# Patient Record
Sex: Female | Born: 1970
Health system: Southern US, Community
[De-identification: ages and names within clinical notes are randomized; demographics above are authoritative.]

## PROBLEM LIST (undated history)

## (undated) DIAGNOSIS — D219 Benign neoplasm of connective and other soft tissue, unspecified: Secondary | ICD-10-CM

## (undated) DIAGNOSIS — J45909 Unspecified asthma, uncomplicated: Secondary | ICD-10-CM

## (undated) DIAGNOSIS — D649 Anemia, unspecified: Secondary | ICD-10-CM

## (undated) HISTORY — DX: Anemia, unspecified: D64.9

## (undated) HISTORY — PX: UTERINE FIBROID SURGERY: SHX826

## (undated) HISTORY — DX: Benign neoplasm of connective and other soft tissue, unspecified: D21.9

---

## 2015-02-26 ENCOUNTER — Emergency Department (HOSPITAL_COMMUNITY)
Admission: EM | Admit: 2015-02-26 | Discharge: 2015-02-26 | Disposition: A | Discharge status: Home / Self Care | Payer: Federal, State, Local not specified - PPO | Source: Home / Self Care | Attending: Emergency Medicine | Admitting: Emergency Medicine

## 2015-02-26 ENCOUNTER — Encounter (HOSPITAL_COMMUNITY): Payer: Self-pay

## 2015-02-26 DIAGNOSIS — L259 Unspecified contact dermatitis, unspecified cause: Secondary | ICD-10-CM

## 2015-02-26 HISTORY — DX: Unspecified asthma, uncomplicated: J45.909

## 2015-02-26 MED ORDER — METHYLPREDNISOLONE ACETATE 80 MG/ML IJ SUSP
INTRAMUSCULAR | Status: AC
  Filled 2015-02-26: qty 1

## 2015-02-26 MED ORDER — METHYLPREDNISOLONE ACETATE 80 MG/ML IJ SUSP: 80.0000 mg | Freq: Once | INTRAMUSCULAR | Status: DC

## 2015-02-26 NOTE — Discharge Instructions (Signed)
Contact Dermatitis Contact dermatitis is a rash that happens when something touches the skin. You touched something that irritates your skin, or you have allergies to something you touched. HOME CARE   Avoid the thing that caused your rash.  Keep your rash away from hot water, soap, sunlight, chemicals, and other things that might bother it.  Do not scratch your rash.  You can take cool baths to help stop itching.  Only take medicine as told by your doctor.  Keep all doctor visits as told. GET HELP RIGHT AWAY IF:   Your rash is not better after 3 days.  Your rash gets worse.  Your rash is puffy (swollen), tender, red, sore, or warm.  You have problems with your medicine. MAKE SURE YOU:   Understand these instructions.  Will watch your condition.  Will get help right away if you are not doing well or get worse. Document Released: 08/31/2009 Document Revised: 01/26/2012 Document Reviewed: 04/08/2011 [facility] Patient Information 2015 [place], [place]. This information is not intended to replace advice given to you by your health care provider. Make sure you discuss any questions you have with your health care provider.    Probably a local reaction or form of eczema. No urgent concerns. The injection will take a few days to fully kick in. Hang in there! :). If this continues would recommend a Dermatologist evaluation. Laurel Dimmer is really good! Nice to meet you.

## 2015-02-26 NOTE — ED Notes (Signed)
C/o facial rash since Thursday. Minimal relief w benadryl . C/o face feels "tingling and tight". NAD

## 2015-02-26 NOTE — ED Provider Notes (Signed)
CSN: [id number]     Arrival date & time 02/26/15  1916 History   First MD Initiated Contact with Patient 02/26/15 2008     Chief Complaint  Patient presents with  . Rash   (Consider location/radiation/quality/duration/timing/severity/associated sxs/prior Treatment) HPI Comments: Patient presents with a facial rash since Thursday requesting a quick fix. It is pruritic. She carries a family history of eczema as well. She did change detergent and perhaps this has irritated her. She has tried otc cortisone cream and her daughters rx cream without great relief.   Patient is a 44 y.o. female presenting with rash. The history is provided by the patient.  Rash   Past Medical History  Diagnosis Date  . Asthma    Past Surgical History  Procedure Laterality Date  . Uterine fibroid surgery     History reviewed. No pertinent family history. History  Substance Use Topics  . Smoking status: Never Smoker   . Smokeless tobacco: Not on file  . Alcohol Use: No   OB History    No data available     Review of Systems  Skin: Positive for rash.  All other systems reviewed and are negative.   Allergies  Penicillins  Home Medications   Prior to Admission medications   Not on File   BP 140/82 mmHg  Pulse 87  Temp(Src) 98.7 F (37.1 C) (Oral)  Resp 12  SpO2 100% Physical Exam  Constitutional: She is oriented to person, place, and time. She appears well-developed and well-nourished.  Pulmonary/Chest: Effort normal.  Neurological: She is alert and oriented to person, place, and time.  Skin: Skin is warm and dry.  Macular papular rash to forehead, nose, cheeks and upper neck. No vessicles  Psychiatric: Her behavior is normal.  Nursing note and vitals reviewed.   ED Course  Procedures (including critical care time) Labs Review Labs Reviewed - No data to display  Imaging Review No results found.   MDM   1. Contact dermatitis and eczema    Requested "shot". Ok to treat with  Depomedrol IM 80mg  given. May take a few days to start improving. No emergent needs. Recommend f/u with Dermatology if persists.     Bjorn Pippin, PA-C 02/26/15 2126

## 2016-09-03 DIAGNOSIS — Z30013 Encounter for initial prescription of injectable contraceptive: Secondary | ICD-10-CM | POA: Diagnosis not present

## 2016-09-03 DIAGNOSIS — Z01419 Encounter for gynecological examination (general) (routine) without abnormal findings: Secondary | ICD-10-CM | POA: Diagnosis not present

## 2017-01-01 ENCOUNTER — Emergency Department (HOSPITAL_COMMUNITY): Payer: Federal, State, Local not specified - PPO

## 2017-01-01 ENCOUNTER — Emergency Department (HOSPITAL_COMMUNITY)
Admission: EM | Admit: 2017-01-01 | Discharge: 2017-01-01 | Disposition: A | Discharge status: Home / Self Care | Payer: Federal, State, Local not specified - PPO | Attending: Emergency Medicine | Admitting: Emergency Medicine

## 2017-01-01 ENCOUNTER — Encounter (HOSPITAL_COMMUNITY): Payer: Self-pay

## 2017-01-01 DIAGNOSIS — M545 Low back pain: Secondary | ICD-10-CM | POA: Insufficient documentation

## 2017-01-01 DIAGNOSIS — D649 Anemia, unspecified: Secondary | ICD-10-CM | POA: Diagnosis not present

## 2017-01-01 DIAGNOSIS — J45909 Unspecified asthma, uncomplicated: Secondary | ICD-10-CM | POA: Diagnosis not present

## 2017-01-01 DIAGNOSIS — M25551 Pain in right hip: Secondary | ICD-10-CM

## 2017-01-01 DIAGNOSIS — M5441 Lumbago with sciatica, right side: Secondary | ICD-10-CM | POA: Diagnosis not present

## 2017-01-01 LAB — I-STAT CHEM 8, ED
BUN: 19 mg/dL (ref 6–20)
CALCIUM ION: 1.17 mmol/L (ref 1.15–1.40)
CHLORIDE: 104 mmol/L (ref 101–111)
Creatinine, Ser: 0.7 mg/dL (ref 0.44–1.00)
GLUCOSE: 85 mg/dL (ref 65–99)
HCT: 32 % — ABNORMAL LOW (ref 36.0–46.0)
HEMOGLOBIN: 10.9 g/dL — AB (ref 12.0–15.0)
POTASSIUM: 4.1 mmol/L (ref 3.5–5.1)
SODIUM: 140 mmol/L (ref 135–145)
TCO2: 26 mmol/L (ref 0–100)

## 2017-01-01 LAB — URINALYSIS, ROUTINE W REFLEX MICROSCOPIC
Bilirubin Urine: NEGATIVE
Glucose, UA: NEGATIVE mg/dL
HGB URINE DIPSTICK: NEGATIVE
Ketones, ur: NEGATIVE mg/dL
Leukocytes, UA: NEGATIVE
NITRITE: NEGATIVE
PROTEIN: NEGATIVE mg/dL
SPECIFIC GRAVITY, URINE: 1.011 (ref 1.005–1.030)
pH: 7 (ref 5.0–8.0)

## 2017-01-01 LAB — POC URINE PREG, ED: Preg Test, Ur: NEGATIVE

## 2017-01-01 MED ORDER — KETOROLAC TROMETHAMINE 30 MG/ML IJ SOLN
30.0000 mg | Freq: Once | INTRAMUSCULAR | Status: AC
  Administered 2017-01-01: 30 mg via INTRAMUSCULAR
  Filled 2017-01-01: qty 1

## 2017-01-01 MED ORDER — PREDNISONE 10 MG (21) PO TBPK
10.0000 mg | ORAL_TABLET | Freq: Every day | ORAL | 0 refills | Status: DC
Start: 2017-01-01 — End: 2017-06-03

## 2017-01-01 MED ORDER — CYCLOBENZAPRINE HCL 10 MG PO TABS
10.0000 mg | ORAL_TABLET | Freq: Three times a day (TID) | ORAL | 0 refills | Status: DC | PRN
Start: 2017-01-01 — End: 2017-06-03

## 2017-01-01 NOTE — Discharge Instructions (Signed)
Your x-rays were normal. This is likely sciatic pain in her lower back. Take the steroids as prescribed. May use Tylenol for pain. Need to follow up with her primary care doctor to have her hemoglobin rechecked. If you develop any dizziness, shortness of breath, blood in her stool, lightheadedness please return to the ED. If you develop any loss of bowel or bladder, or unable to urinate, numbness in her groin return to the ED. Follow-up with orthopedist if your back pain continues.

## 2017-01-01 NOTE — ED Provider Notes (Signed)
[facility] DEPT Provider Note   CSN: [id number] Arrival date & time: 01/01/17  0907     History   Chief Complaint Chief Complaint  Patient presents with  . Back Pain    HPI Tracy Santos is a 46 y.o. female.  46 yo African-American female with past medical history significant for asthma presents to the ED today with complaint of low back pain and right hip pain times "a couple of months". Patient states that her pain started in her lower back and has since radiated to her right hip. She thought that it was a mattress that she was sleeping on and they recently changed her mattress. She denies any injury. Patient states that when she lies down and turn side to side the pain is worse. Nothing makes the pain better. She has been trying Aleve and Motrin daily for pain with little relief. Patient describes the pain as aching/sharp. Patient denies any fever, chills, headache, vision changes, bowel or bladder incontinence, urinary retention, saddle paresthesias, lower extremity paresthesias, history of IV drug use, history of cancer, lower extremity weakness, nausea, emesis, urinary symptoms including urgency, frequency, hematuria, dysuria, vaginal discharge/bleeding, change in bowel habits. Patient has been ambulatory with normal gait since the onset of her pain.      Past Medical History:  Diagnosis Date  . Asthma     There are no active problems to display for this patient.   Past Surgical History:  Procedure Laterality Date  . UTERINE FIBROID SURGERY      OB History    No data available       Home Medications    Prior to Admission medications   Not on File    Family History No family history on file.  Social History Social History  Substance Use Topics  . Smoking status: Never Smoker  . Smokeless tobacco: Never Used  . Alcohol use No     Allergies   Penicillins   Review of Systems Review of Systems  Constitutional: Negative for chills and  fever.  Gastrointestinal: Negative for diarrhea, nausea and vomiting.  Genitourinary: Negative for dysuria, flank pain, frequency and urgency.  Musculoskeletal: Positive for arthralgias, back pain and myalgias. Negative for gait problem, neck pain and neck stiffness.  Skin: Negative.   Neurological: Negative for dizziness, syncope, weakness, light-headedness, numbness and headaches.  All other systems reviewed and are negative.    Physical Exam Updated Vital Signs BP 139/98   Pulse 71   Temp 98.1 F (36.7 C) (Oral)   Resp 16   Ht 5\' 8"  (1.727 m)   Wt 117 kg   LMP 12/18/2016 (Within Days)   SpO2 100%   BMI 39.23 kg/m   Physical Exam  Constitutional: She is oriented to person, place, and time. She appears well-developed and well-nourished. No distress.  She is nontoxic-appearing and sitting in the bed comfortably in no acute distress.  HENT:  Head: Normocephalic and atraumatic.  Eyes: Conjunctivae and EOM are normal. Pupils are equal, round, and reactive to light. Right eye exhibits no discharge. Left eye exhibits no discharge. No scleral icterus.  Neck: Normal range of motion. Neck supple.  Pulmonary/Chest: No respiratory distress.  Abdominal:  No CVA tenderness.  Musculoskeletal: Normal range of motion.  Patient with midline L-spine tenderness. She does have tenderness to palpation of the paraspinal lumbar muscles. Note T-spine midline tenderness. No deformities or step-offs noted. Pain radiates to the right hip with palpation. Positive straight leg raise test on the right that  reproduces the pain but no paresthesias. No erythema, ecchymosis, edema noted. Strength is 5 out of 5 in lower extremity. Sensation intact sharp/dull. Cap refill normal. DP pulses are 2+ bilaterally. Patient is able to mainly with normal gait.  Neurological: She is alert and oriented to person, place, and time.  The patient is alert, attentive, and oriented x 3. Speech is clear. Cranial nerve II-VII  grossly intact. Negative pronator drift. Sensation intact. Strength 5/5 in all extremities. Reflexes 2+ and symmetric at biceps, triceps, knees, and ankles. Rapid alternating movement and fine finger movements intact. Romberg is absent. Posture and gait normal.   Skin: Skin is warm and dry. Capillary refill takes less than 2 seconds. No pallor.  Nursing note and vitals reviewed.    ED Treatments / Results  Labs (all labs ordered are listed, but only abnormal results are displayed) Labs Reviewed  URINALYSIS, ROUTINE W REFLEX MICROSCOPIC - Abnormal; Notable for the following:       Result Value   Color, Urine STRAW (*)    All other components within normal limits  I-STAT CHEM 8, ED - Abnormal; Notable for the following:    Hemoglobin 10.9 (*)    HCT 32.0 (*)    All other components within normal limits  POC URINE PREG, ED    EKG  EKG Interpretation None       Radiology Dg Lumbar Spine Complete  Result Date: 01/01/2017 CLINICAL DATA:  Low back pain for about 6 months, right hip pain 2 months EXAM: LUMBAR SPINE - COMPLETE 4+ VIEW COMPARISON:  None. FINDINGS: Five views of the lumbar spine submitted. No acute fracture or subluxation. Alignment, disc spaces and vertebral body heights are preserved. IMPRESSION: Negative. Electronically Signed   By: Lahoma Crocker M.D.   On: 01/01/2017 11:30   Dg Hip Unilat W Or Wo Pelvis 2-3 Views Right  Result Date: 01/01/2017 CLINICAL DATA:  Back pain, right hip pain for about 2 months EXAM: DG HIP (WITH OR WITHOUT PELVIS) 2-3V RIGHT COMPARISON:  None. FINDINGS: Three views of the right hip submitted. No acute fracture or subluxation. Minimal superior acetabular spurring. Minimal spurring or tendinous calcifications adjacent to greater femoral trochanter. Bilateral hip joints are symmetrical in appearance. IMPRESSION: No acute fracture or subluxation. Bilateral hip joints are symmetrical in appearance. Minimal spurring or tendinous calcifications adjacent  to right greater femoral trochanter. Electronically Signed   By: Lahoma Crocker M.D.   On: 01/01/2017 11:32    Procedures Procedures (including critical care time)  Medications Ordered in ED Medications  ketorolac (TORADOL) 30 MG/ML injection 30 mg (not administered)     Initial Impression / Assessment and Plan / ED Course  I have reviewed the triage vital signs and the nursing notes.  Pertinent labs & imaging results that were available during my care of the patient were reviewed by me and considered in my medical decision making (see chart for details).     Patient presents to the ED with complaints of low back pain and right hip pain. Symptoms have been ongoing for 3 months. Patient does have midline L-spine tenderness. Positive straight leg raise test on the right. We will obtain lumbar and right hip imaging. We will check urine for signs of infection. Will check I stat chem 8 for kidney function in order to give Toradol. Symptoms likely consistent with sciatic pain. It imaging is normal and UA without signs of infection will treat with steroids and Toradol. No neurological deficits and normal neuro exam.  Patient can walk but states is painful.  No loss of bowel or bladder control.  No concern for cauda equina.  No fever, night sweats, weight loss, h/o cancer, IVDU.  Imaging shows no acute abnormalities. Patient is ambulatory with normal gait. Her creatinine was normal and we'll give Toradol. Patient noted to have a hemoglobin of 10.9. Patient states that she has been told the past that she has anemia. Unaware of her baseline hemoglobin. She denies any melena or hematochezia. She denies any lightheadedness or dizziness or shortness of breath. She is not symptomatic. Encouraged to follow up PCP to have her hemoglobin rechecked. Given her strict return precautions she develops any blood in her stool or worse symptoms with the anemia. Urine shows no signs of infection. Will treat with Toradol and  steroids and follow-up with orthopedist for symptoms persist. Patient given lower back stretching instructions. RICE protocol and pain medicine indicated and discussed with patient. Pt is hemodynamically stable, in NAD, & able to ambulate in the ED. Pain has been managed & has no complaints prior to dc. Pt is comfortable with above plan and is stable for discharge at this time. All questions were answered prior to disposition. Strict return precautions for f/u to the ED were discussed.    Final Clinical Impressions(s) / ED Diagnoses   Final diagnoses:  Acute right-sided low back pain with right-sided sciatica  Right hip pain  Anemia, unspecified type    New Prescriptions New Prescriptions   PREDNISONE (STERAPRED UNI-PAK 21 TAB) 10 MG (21) TBPK TABLET    Take 1 tablet (10 mg total) by mouth daily. Take 6 tabs by mouth daily  for 2 days, then 5 tabs for 2 days, then 4 tabs for 2 days, then 3 tabs for 2 days, 2 tabs for 2 days, then 1 tab by mouth daily for 2 days     Doristine Devoid, PA-C 01/01/17 [place], MD 01/01/17 [phone]

## 2017-01-01 NOTE — ED Triage Notes (Signed)
Pt presents for evaluation of lower back and R hip pain x "couple months." Pt denies injury. Reports has been taking aleve and motrin daily for pain. States pain improves slightly with medication but is much worse when laying in bed. Describes as aching/sharp. Pt denies neurological changes or incontinence.

## 2017-01-01 NOTE — ED Notes (Signed)
Ambulated pt to restroom to collect urine sample, no issues. Pt back on monitor.

## 2017-01-01 NOTE — ED Notes (Signed)
Pt A&Ox4, ambulatory at d/c with steady gait, NAD and verbalized understanding of d/c instructions, prescriptions and follow up care.

## 2017-06-03 ENCOUNTER — Encounter: Payer: Self-pay | Admitting: Obstetrics and Gynecology

## 2017-06-03 ENCOUNTER — Ambulatory Visit (INDEPENDENT_AMBULATORY_CARE_PROVIDER_SITE_OTHER): Payer: Federal, State, Local not specified - PPO | Admitting: Obstetrics and Gynecology

## 2017-06-03 VITALS — BP 130/90 | HR 88 | Resp 16 | Ht 67.25 in | Wt 265.0 lb

## 2017-06-03 DIAGNOSIS — N852 Hypertrophy of uterus: Secondary | ICD-10-CM | POA: Diagnosis not present

## 2017-06-03 DIAGNOSIS — Z3009 Encounter for other general counseling and advice on contraception: Secondary | ICD-10-CM | POA: Diagnosis not present

## 2017-06-03 DIAGNOSIS — R030 Elevated blood-pressure reading, without diagnosis of hypertension: Secondary | ICD-10-CM | POA: Diagnosis not present

## 2017-06-03 DIAGNOSIS — N920 Excessive and frequent menstruation with regular cycle: Secondary | ICD-10-CM

## 2017-06-03 DIAGNOSIS — Z86018 Personal history of other benign neoplasm: Secondary | ICD-10-CM | POA: Diagnosis not present

## 2017-06-03 LAB — POCT URINE PREGNANCY: PREG TEST UR: NEGATIVE

## 2017-06-03 NOTE — Progress Notes (Signed)
46 y.o. [id number] MarriedAfrican AmericanF here c/o irregular menstrual cycles.   She got one dose of Depo-provera in October of 2018. Cycles started getting irregular in November. She went on it to make her cycles go away. Also for contraception.  Since November cycles have been coming about every 3 weeks, bleeding for 8-10 days (3 x since she had the depo-provea), up from 4 days. Cycles were normal flow up until the last few months, changing a super tampon in 2-3 hours. She can now saturate a super tampon in 2 hours.  No constipation, dry skin, hair loss or fatigue, no weight changes.  Sexually active, no pain.  She was just using W/D for contraception prior to and since the depo-provea.  She tried to get pregnant a few years ago and it didn't happen.  Period Duration (Days): cycles coming twice a month Period Pattern: (!) Irregular Menstrual Flow: Heavy Menstrual Control: Tampon, Maxi pad Menstrual Control Change Freq (Hours): changes tampon and pad every 2 hours  Dysmenorrhea: None  Patient's last menstrual period was 05/16/2017.          Sexually active: Yes.    The current method of family planning is none.    Exercising: No.  The patient does not participate in regular exercise at present. Smoker:  no  Health Maintenance: Pap:  08/2016 WNL per patient (at health clinic) History of abnormal Pap:  22 years ago MMG:  2013 WNL per patient Colonoscopy: Never  BMD:   Never TDaP:  2017  Gardasil: N/A   reports that she has never smoked. She has never used smokeless tobacco. She reports that she does not drink alcohol or use drugs.kids are 43 (daughter) and 51 (son). She moved here 2 years ago from [place]. She is a Development worker, community carrier.   Past Medical History:  Diagnosis Date  . Asthma   . Fibroid     Past Surgical History:  Procedure Laterality Date  . UTERINE FIBROID SURGERY    She had a laparotomy and myomectomy in 2006.  She had a hysteroscopically removed in 2009 or 2010.   No  current outpatient prescriptions on file.   No current facility-administered medications for this visit.     No family history on file.  Review of Systems  Constitutional: Negative.   HENT: Negative.   Eyes: Negative.   Respiratory: Negative.   Cardiovascular: Negative.   Gastrointestinal: Negative.   Endocrine: Negative.   Genitourinary: Positive for menstrual problem.       Irregular menstrual bleeding   Musculoskeletal: Negative.   Skin: Negative.   Allergic/Immunologic: Negative.   Neurological: Negative.   Psychiatric/Behavioral: Negative.     Exam:   BP 130/90 (BP Location: Left Arm, Patient Position: Sitting, Cuff Size: Large)   Pulse 88   Resp 16   Ht 5' 7.25" (1.708 m)   Wt 265 lb (120.2 kg)   LMP 05/16/2017   BMI 41.20 kg/m   Weight change: @WEIGHTCHANGE @ Height:   Height: 5' 7.25" (170.8 cm)  Ht Readings from Last 3 Encounters:  06/03/17 5' 7.25" (1.708 m)  01/01/17 5\' 8"  (1.727 m)    General appearance: alert, cooperative and appears stated age Head: Normocephalic, without obvious abnormality, atraumatic Neck: no adenopathy, supple, symmetrical, trachea midline and thyroid normal to inspection and palpation Lungs: clear to auscultation bilaterally Cardiovascular: regular rate and rhythm Abdomen: soft, non-tender; bowel sounds normal; no masses,  no organomegaly Extremities: extremities normal, atraumatic, no cyanosis or edema Skin: Skin color, texture, turgor  normal. No rashes or lesions Lymph nodes: Cervical, supraclavicular, and axillary nodes normal. No abnormal inguinal nodes palpated Neurologic: Grossly normal   Pelvic: External genitalia:  no lesions              Urethra:  normal appearing urethra with no masses, tenderness or lesions              Bartholins and Skenes: normal                 Vagina: normal appearing vagina with normal color and discharge, no lesions              Cervix: no lesions               Bimanual Exam:  Uterus:   anteverted, irregularly sized, suspect fibroid on the right, not tender, approximately 10 week sized              Adnexa: no mass, fullness, tenderness               Rectovaginal: Confirms               Anus:  normal sphincter tone, no lesions  Chaperone was present for exam.  A:  Menorrhagia, 3 episodes of bleeding for more than 7 days since she got a shot of depo-provera last fall. She does report heavy cycles  H/O fibroid uterus, enlarged on exam today  Contraception  Elevated BP, she states it goes up and down  P:   CBC, Ferritin, TSH  Return for an ultrasound, possible sonohysterogram  Depending on results, possible mirena IUD candidate, probably not a great candidate for OCP's given BP  Will recheck her BP at her f/u visit

## 2017-06-04 ENCOUNTER — Telehealth: Payer: Self-pay | Admitting: Obstetrics and Gynecology

## 2017-06-04 LAB — CBC
Hematocrit: 29.6 % — ABNORMAL LOW (ref 34.0–46.6)
Hemoglobin: 9 g/dL — ABNORMAL LOW (ref 11.1–15.9)
MCH: 24 pg — ABNORMAL LOW (ref 26.6–33.0)
MCHC: 30.4 g/dL — ABNORMAL LOW (ref 31.5–35.7)
MCV: 79 fL (ref 79–97)
Platelets: 274 10*3/uL (ref 150–379)
RBC: 3.75 x10E6/uL — ABNORMAL LOW (ref 3.77–5.28)
RDW: 15.5 % — ABNORMAL HIGH (ref 12.3–15.4)
WBC: 3.4 10*3/uL (ref 3.4–10.8)

## 2017-06-04 LAB — TSH: TSH: 1.87 u[IU]/mL (ref 0.450–4.500)

## 2017-06-04 LAB — FERRITIN: Ferritin: 6 ng/mL — ABNORMAL LOW (ref 15–150)

## 2017-06-04 NOTE — Telephone Encounter (Signed)
Patients spouse, Lanah Steines,  returned call. Mr Chisom is listed on the Designated Party Release, sign by patient on 06/03/17. Spouse states he and the patient are in the car, the patient is driving and unavailable to talk. Appointment was scheduled through conversation with spouse, on 06/16/17 with Dr Talbert Nan. Spouse will have the patient to call back to our office when she is not driving, in order to convey benefits and appointment instructions.   Routing to dr Talbert Nan

## 2017-06-04 NOTE — Telephone Encounter (Signed)
Call placed to patient to review benefits for a recommended sonohysterogram. Left voicemail requesting a return call.

## 2017-06-05 ENCOUNTER — Telehealth: Payer: Self-pay

## 2017-06-05 NOTE — Telephone Encounter (Addendum)
Spoke with patient. Results given. Patient verbalizes understanding. Patient is scheduled for PUS on 06/16/2017 at 3 pm.

## 2017-06-05 NOTE — Telephone Encounter (Signed)
-----   Message from Salvadore Dom, MD sent at 06/05/2017  8:06 AM EDT ----- Regarding: FW: Please let the patient know that she is anemic with low iron stores. An ultrasound was ordered, please make sure it is scheduled. Have her start taking ferrex 150 mg BID for her anemia. ----- Message ----- From: Nicholes Rough, CMA Sent: 06/03/2017  10:32 AM To: Salvadore Dom, MD

## 2017-06-08 ENCOUNTER — Encounter: Payer: Self-pay | Admitting: Obstetrics and Gynecology

## 2017-06-16 ENCOUNTER — Ambulatory Visit (INDEPENDENT_AMBULATORY_CARE_PROVIDER_SITE_OTHER): Payer: Federal, State, Local not specified - PPO

## 2017-06-16 ENCOUNTER — Other Ambulatory Visit: Payer: Self-pay | Admitting: Obstetrics and Gynecology

## 2017-06-16 ENCOUNTER — Ambulatory Visit (INDEPENDENT_AMBULATORY_CARE_PROVIDER_SITE_OTHER): Payer: Federal, State, Local not specified - PPO | Admitting: Obstetrics and Gynecology

## 2017-06-16 ENCOUNTER — Encounter: Payer: Self-pay | Admitting: Obstetrics and Gynecology

## 2017-06-16 VITALS — BP 142/80 | HR 88 | Resp 16 | Wt 272.0 lb

## 2017-06-16 DIAGNOSIS — Z86018 Personal history of other benign neoplasm: Secondary | ICD-10-CM

## 2017-06-16 DIAGNOSIS — D500 Iron deficiency anemia secondary to blood loss (chronic): Secondary | ICD-10-CM

## 2017-06-16 DIAGNOSIS — D259 Leiomyoma of uterus, unspecified: Secondary | ICD-10-CM

## 2017-06-16 DIAGNOSIS — R030 Elevated blood-pressure reading, without diagnosis of hypertension: Secondary | ICD-10-CM | POA: Diagnosis not present

## 2017-06-16 DIAGNOSIS — N920 Excessive and frequent menstruation with regular cycle: Secondary | ICD-10-CM | POA: Diagnosis not present

## 2017-06-16 DIAGNOSIS — N852 Hypertrophy of uterus: Secondary | ICD-10-CM | POA: Diagnosis not present

## 2017-06-16 MED ORDER — NORETHINDRONE 0.35 MG PO TABS: 1.0000 | ORAL_TABLET | Freq: Every day | ORAL | 0 refills | Status: DC

## 2017-06-16 NOTE — Progress Notes (Signed)
GYNECOLOGY  VISIT   HPI: 46 y.o.   Married  [place] American  female   [phone] with Patient's last menstrual period was 06/13/2017.   here for f/u of menorrhagia, fibroid uterus, and anemia.  No dysmenorrhea, no dyspareunia. Using w/d for contraception (h/o secondary infertility). Recent normal TSH. Hgb of 9 gm/dl, ferritin of 6.   GYNECOLOGIC HISTORY: Patient's last menstrual period was 06/13/2017. Contraception:none Menopausal hormone therapy: none         OB History    Gravida Para Term Preterm AB Living   3 2 2   1 2    SAB TAB Ectopic Multiple Live Births           2         There are no active problems to display for this patient.   Past Medical History:  Diagnosis Date  . Asthma   . Fibroid     Past Surgical History:  Procedure Laterality Date  . UTERINE FIBROID SURGERY      No current outpatient prescriptions on file.   No current facility-administered medications for this visit.      ALLERGIES: Patient has no active allergies.  No family history on file.  Social History   Social History  . Marital status: Married    Spouse name: N/A  . Number of children: N/A  . Years of education: N/A   Occupational History  . Not on file.   Social History Main Topics  . Smoking status: Never Smoker  . Smokeless tobacco: Never Used  . Alcohol use No  . Drug use: No  . Sexual activity: Yes    Partners: Male    Birth control/ protection: None   Other Topics Concern  . Not on file   Social History Narrative  . No narrative on file    Review of Systems  Constitutional: Negative.   HENT: Negative.   Eyes: Negative.   Respiratory: Negative.   Cardiovascular: Negative.   Gastrointestinal: Negative.   Genitourinary: Negative.   Musculoskeletal: Negative.   Skin: Negative.   Neurological: Negative.   Endo/Heme/Allergies: Negative.   Psychiatric/Behavioral: Negative.     PHYSICAL EXAMINATION:    BP (!) 142/80 (BP Location: Right Arm, Patient  Position: Sitting, Cuff Size: Normal)   Pulse 88   Resp 16   Wt 272 lb (123.4 kg)   LMP 06/13/2017   BMI 42.29 kg/m     General appearance: alert, cooperative and appears stated age  Pelvic: External genitalia:  no lesions              Urethra:  normal appearing urethra with no masses, tenderness or lesions              Bartholins and Skenes: normal                 Vagina: normal appearing vagina with normal color and discharge, no lesions              Cervix:no lesions Chaperone was present for exam.  Reviewed ultrasound images with the patient and her husband  ASSESSMENT Menorrhagia Fibroid uterus, one intracavitary, appears to be >= 50% in her cavity. Over 1 cm of myometrium beyond the fibroid. Discussed the options of attempted hysteroscopic resection, then possible mirena IUD, use of micronor. Discussed the option of TLH/BS Anemia    PLAN Start micronor today Will set her up for a hysteroscopic myomectomy    An After Visit Summary was printed and given to the  patient.  25 minutes face to face time of which over 50% was spent in counseling.   CC: Lamont Snowball, RN

## 2017-06-25 ENCOUNTER — Telehealth: Payer: Self-pay | Admitting: Obstetrics and Gynecology

## 2017-06-25 NOTE — Telephone Encounter (Signed)
Call placed to patient to review benefits for a recommended surgical procedure. Left voicemail message requesting a return call.    cc: Tracy Santos  cc: Tracy Santos

## 2017-06-25 NOTE — Telephone Encounter (Signed)
Patient has returned call. Reviewed benefit for recommended surgical procedure. Patient understood information presented. Patient request to speak with a nurse regarding additional questions with the procedure and what she can expect with recovery. Advised patient I will forward to our Nurse Supervisor, to return call and address additional questions.  Routing to Lamont Snowball, RN

## 2017-06-26 NOTE — Telephone Encounter (Signed)
Call to patient. Had questions regarding recovery and time off from work. 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 Discussed procedure day plus 2 post op days ( one additional form the normal) due to job requirements. Potential date options discussed. Patient will check on these and call back to schedule.  Routing to provider for final review. Patient agreeable to disposition. Will close encounter.

## 2017-07-27 ENCOUNTER — Telehealth: Payer: Self-pay | Admitting: *Deleted

## 2017-07-27 NOTE — Telephone Encounter (Signed)
Call to patient to check on plans for surgery. Per ROI can leave message on voice mail. Phone number confirmed on call lo. Left message to call back regarding surgery date plans.

## 2017-08-19 NOTE — Telephone Encounter (Signed)
Follow-up call to patient. Per ROI, cal leave message on cell voice mail. Left message calling for update on plans for surgery. Dates for end of year will fill quickly and need to know her plans for proceeding. Left message to call back with update.

## 2017-09-25 NOTE — Telephone Encounter (Signed)
Call to patient to follow-up on plan for surgery scheduling. Unable to leave message. No answer and no voice mail. Voice mail repeats " I am not able to take your call but stay on the line and your call will be answered shortly." No answer and 1 min 30 sec.

## 2017-09-25 NOTE — Telephone Encounter (Signed)
No response from patient after multiple attempts over several months.  Please advise if any additional attempts are needed.  Routing to Dr Talbert Nan for review.

## 2017-09-28 NOTE — Telephone Encounter (Signed)
The patient will call back if she wants to proceed. Will close the encounter.

## 2017-11-19 ENCOUNTER — Encounter: Payer: Self-pay | Admitting: Obstetrics and Gynecology

## 2019-01-31 ENCOUNTER — Other Ambulatory Visit: Payer: Federal, State, Local not specified - PPO

## 2019-02-01 ENCOUNTER — Other Ambulatory Visit: Payer: Federal, State, Local not specified - PPO

## 2019-03-08 ENCOUNTER — Encounter: Payer: Self-pay | Admitting: Internal Medicine

## 2019-03-08 ENCOUNTER — Other Ambulatory Visit: Payer: Self-pay | Admitting: Internal Medicine

## 2019-03-08 ENCOUNTER — Ambulatory Visit (INDEPENDENT_AMBULATORY_CARE_PROVIDER_SITE_OTHER): Payer: Federal, State, Local not specified - PPO | Admitting: Internal Medicine

## 2019-03-08 ENCOUNTER — Other Ambulatory Visit: Payer: Self-pay

## 2019-03-08 VITALS — BP 130/80 | HR 97 | Temp 98.7°F | Ht 67.5 in | Wt 266.2 lb

## 2019-03-08 DIAGNOSIS — E559 Vitamin D deficiency, unspecified: Secondary | ICD-10-CM | POA: Insufficient documentation

## 2019-03-08 DIAGNOSIS — R42 Dizziness and giddiness: Secondary | ICD-10-CM

## 2019-03-08 DIAGNOSIS — Z0000 Encounter for general adult medical examination without abnormal findings: Secondary | ICD-10-CM | POA: Diagnosis not present

## 2019-03-08 DIAGNOSIS — N926 Irregular menstruation, unspecified: Secondary | ICD-10-CM

## 2019-03-08 DIAGNOSIS — D509 Iron deficiency anemia, unspecified: Secondary | ICD-10-CM | POA: Insufficient documentation

## 2019-03-08 LAB — LIPID PANEL
Cholesterol: 203 mg/dL — ABNORMAL HIGH (ref 0–200)
HDL: 75.7 mg/dL (ref >39.00)
LDL Cholesterol: 117 mg/dL — ABNORMAL HIGH (ref 0–99)
NonHDL: 127.21
Total CHOL/HDL Ratio: 3
Triglycerides: 53 mg/dL (ref 0.0–149.0)
VLDL: 10.6 mg/dL (ref 0.0–40.0)

## 2019-03-08 LAB — COMPREHENSIVE METABOLIC PANEL
ALT: 15 U/L (ref 0–35)
AST: 13 U/L (ref 0–37)
Albumin: 4 g/dL (ref 3.5–5.2)
Alkaline Phosphatase: 111 U/L (ref 39–117)
BUN: 11 mg/dL (ref 6–23)
CO2: 26 mEq/L (ref 19–32)
Calcium: 9.3 mg/dL (ref 8.4–10.5)
Chloride: 103 mEq/L (ref 96–112)
Creatinine, Ser: 0.77 mg/dL (ref 0.40–1.20)
GFR: 96.48 mL/min (ref >60.00)
Glucose, Bld: 86 mg/dL (ref 70–99)
Potassium: 4.2 mEq/L (ref 3.5–5.1)
Sodium: 138 mEq/L (ref 135–145)
Total Bilirubin: 0.3 mg/dL (ref 0.2–1.2)
Total Protein: 6.9 g/dL (ref 6.0–8.3)

## 2019-03-08 LAB — POCT URINE PREGNANCY: Preg Test, Ur: NEGATIVE

## 2019-03-08 LAB — CBC WITH DIFFERENTIAL/PLATELET
Basophils Absolute: 0 10*3/uL (ref 0.0–0.1)
Basophils Relative: 0.7 % (ref 0.0–3.0)
Eosinophils Absolute: 0 10*3/uL (ref 0.0–0.7)
Eosinophils Relative: 0.6 % (ref 0.0–5.0)
HCT: 31.2 % — ABNORMAL LOW (ref 36.0–46.0)
Hemoglobin: 10.1 g/dL — ABNORMAL LOW (ref 12.0–15.0)
Lymphocytes Relative: 36.1 % (ref 12.0–46.0)
Lymphs Abs: 1.4 10*3/uL (ref 0.7–4.0)
MCHC: 32.3 g/dL (ref 30.0–36.0)
MCV: 75.5 fl — ABNORMAL LOW (ref 78.0–100.0)
Monocytes Absolute: 0.3 10*3/uL (ref 0.1–1.0)
Monocytes Relative: 7.1 % (ref 3.0–12.0)
Neutro Abs: 2.2 10*3/uL (ref 1.4–7.7)
Neutrophils Relative %: 55.5 % (ref 43.0–77.0)
Platelets: 303 10*3/uL (ref 150.0–400.0)
RBC: 4.13 Mil/uL (ref 3.87–5.11)
RDW: 16.7 % — ABNORMAL HIGH (ref 11.5–15.5)
WBC: 3.9 10*3/uL — ABNORMAL LOW (ref 4.0–10.5)

## 2019-03-08 LAB — VITAMIN D 25 HYDROXY (VIT D DEFICIENCY, FRACTURES): VITD: 13.75 ng/mL — ABNORMAL LOW (ref 30.00–100.00)

## 2019-03-08 LAB — VITAMIN B12: Vitamin B-12: 281 pg/mL (ref 211–911)

## 2019-03-08 LAB — TSH: TSH: 3.14 u[IU]/mL (ref 0.35–4.50)

## 2019-03-08 MED ORDER — MECLIZINE HCL 25 MG PO TABS: 25.0000 mg | ORAL_TABLET | Freq: Three times a day (TID) | ORAL | 0 refills | Status: DC | PRN

## 2019-03-08 MED ORDER — VITAMIN D (ERGOCALCIFEROL) 1.25 MG (50000 UNIT) PO CAPS: 50000.0000 [IU] | ORAL_CAPSULE | ORAL | 0 refills | Status: DC

## 2019-03-08 NOTE — Addendum Note (Signed)
Addended by: Elmer Picker on: 03/08/2019 03:59 PM   Modules accepted: Orders

## 2019-03-08 NOTE — Progress Notes (Signed)
New Patient Office Visit     CC/Reason for Visit: Establish care, discuss dizziness and menstrual irregularities.  HPI: Tracy Santos is a 48 y.o. female who is coming in today for the above mentioned reasons. She is due for an annual physical. She starting taking a MVI last week. She took 3 tabs at once to give it a "jump-start". She has had 2 episodes of dizziness since then, the second one was severe. This occurred yesterday. She was sitting at home, suddenly had sensation of room spinning around her that lasted for a few minutes. No N/V. No palpitations, SOB, she did not feel like she was going to pass out. She has not had a recent URI. Does not have decreased hearing. Never had ear problems that she is aware of.  She just completed her period 2 weeks ago. She has started bleeding again. She is not sure what age her mother started menopause. She is sexually active with her husband.  Past Medical/Surgical History: Past Medical History:  Diagnosis Date  . Asthma   . Fibroid     Past Surgical History:  Procedure Laterality Date  . UTERINE FIBROID SURGERY      Social History:  reports that she has never smoked. She has never used smokeless tobacco. She reports that she does not drink alcohol or use drugs.  Allergies: No Active Allergies  Family History:  No h/o heart disease, stroke, cancer that she is aware of.  Current Outpatient Medications:  .  acetaminophen (TYLENOL) 325 MG tablet, Take 650 mg by mouth every 6 (six) hours as needed., Disp: , Rfl:  .  Multiple Vitamin (MULTIVITAMIN WITH MINERALS) TABS tablet, Take 1 tablet by mouth daily., Disp: , Rfl:  .  meclizine (MEDI-MECLIZINE) 25 MG tablet, Take 1 tablet (25 mg total) by mouth 3 (three) times daily as needed for dizziness., Disp: 30 tablet, Rfl: 0  Review of Systems:  Constitutional: Denies fever, chills, diaphoresis, appetite change and fatigue.  HEENT: Denies photophobia, eye pain, redness, hearing  loss, ear pain, congestion, sore throat, rhinorrhea, sneezing, mouth sores, trouble swallowing, neck pain, neck stiffness and tinnitus.   Respiratory: Denies SOB, DOE, cough, chest tightness,  and wheezing.   Cardiovascular: Denies chest pain, palpitations and leg swelling.  Gastrointestinal: Denies nausea, vomiting, abdominal pain, diarrhea, constipation, blood in stool and abdominal distention.  Genitourinary: Denies dysuria, urgency, frequency, hematuria, flank pain and difficulty urinating.  Endocrine: Denies: hot or cold intolerance, sweats, changes in hair or nails, polyuria, polydipsia. Musculoskeletal: Denies myalgias, back pain, joint swelling, arthralgias and gait problem.  Skin: Denies pallor, rash and wound.  Neurological: Denies  seizures, syncope, weakness, light-headedness, numbness and headaches.  Hematological: Denies adenopathy. Easy bruising, personal or family bleeding history  Psychiatric/Behavioral: Denies suicidal ideation, mood changes, confusion, nervousness, sleep disturbance and agitation    Physical Exam: Vitals:   03/08/19 1137  BP: 130/80  Pulse: 97  Temp: 98.7 F (37.1 C)  TempSrc: Oral  SpO2: 99%  Weight: 266 lb 3.2 oz (120.7 kg)  Height: 5' 7.5" (1.715 m)    Body mass index is 41.08 kg/m.   Constitutional: NAD, calm, comfortable Eyes: PERRL, lids and conjunctivae normal, wears corrective lenses. ENMT: Mucous membranes are moist. Posterior pharynx clear of any exudate or lesions. Normal dentition.  Neck: normal, supple, no masses, no thyromegaly Respiratory: clear to auscultation bilaterally, no wheezing, no crackles. Normal respiratory effort. No accessory muscle use.  Cardiovascular: Regular rate and rhythm, no murmurs / rubs /  gallops. No extremity edema. 2+ pedal pulses. No carotid bruits.  Musculoskeletal: no clubbing / cyanosis. No joint deformity upper and lower extremities. Good ROM, no contractures. Normal muscle tone.  Psychiatric:  Normal judgment and insight. Alert and oriented x 3. Normal mood.    Impression and Plan:  Dizziness  -I have not been able to elicit dizziness or nystagmus today with the Dixie-Hallpike maneuver; doubt BPPV. -No recent URI to make me suspicious for acute labyrinthitis. -No focal neurologic deficits to make me concerned for a posterior circulation CVA. -Not orthostatic. -Regular heart rhythm (do not think EKG is necessary today). -Will give meclizine to use PRN. -She will notify us if she has recurrent symptoms.  Encounter for preventive health examination  -She will get her CPE labs today (her request since she is fasting ) and will return over the summer for her CPE.  Menstrual abnormality -Check POC pregnancy. -If negative, may be perimenopausal. -Consider GYN referral. -Will check TSH.     Patient Instructions  -Nice seeing you today and I hope you feel better soon!  -Lab work today. Will call you when results are available.  -Take meclizine as needed for dizziness.  -Schedule follow up over the summer for your annual physical.     Lelon Frohlich, MD [facility]

## 2019-03-08 NOTE — Addendum Note (Signed)
Addended by: Elmer Picker on: 03/08/2019 03:57 PM   Modules accepted: Orders

## 2019-03-08 NOTE — Patient Instructions (Signed)
-  Nice seeing you today and I hope you feel better soon!  -Lab work today. Will call you when results are available.  -Take meclizine as needed for dizziness.  -Schedule follow up over the summer for your annual physical.

## 2019-03-15 ENCOUNTER — Encounter: Payer: Self-pay | Admitting: Internal Medicine

## 2019-03-15 ENCOUNTER — Telehealth: Payer: Self-pay | Admitting: Internal Medicine

## 2019-03-15 ENCOUNTER — Other Ambulatory Visit (INDEPENDENT_AMBULATORY_CARE_PROVIDER_SITE_OTHER): Payer: Federal, State, Local not specified - PPO

## 2019-03-15 ENCOUNTER — Other Ambulatory Visit: Payer: Self-pay

## 2019-03-15 DIAGNOSIS — R42 Dizziness and giddiness: Secondary | ICD-10-CM | POA: Diagnosis not present

## 2019-03-15 NOTE — Telephone Encounter (Signed)
Patient wants to know what the labs were for today that she had done.  She is requesting a call back. [phone]

## 2019-03-16 ENCOUNTER — Encounter: Payer: Self-pay | Admitting: Internal Medicine

## 2019-03-16 ENCOUNTER — Other Ambulatory Visit: Payer: Self-pay | Admitting: Internal Medicine

## 2019-03-16 DIAGNOSIS — D508 Other iron deficiency anemias: Secondary | ICD-10-CM

## 2019-03-16 LAB — ANEMIA PANEL
Ferritin: 7 ng/mL — ABNORMAL LOW (ref 15–150)
Folate, Hemolysate: 313 ng/mL
Folate, RBC: 1061 ng/mL (ref >498)
Hematocrit: 29.5 % — ABNORMAL LOW (ref 34.0–46.6)
Iron Saturation: 5 % — CL (ref 15–55)
Iron: 19 ug/dL — ABNORMAL LOW (ref 27–159)
Retic Ct Pct: 1.3 % (ref 0.6–2.6)
Total Iron Binding Capacity: 416 ug/dL (ref 250–450)
UIBC: 397 ug/dL (ref 131–425)
Vitamin B-12: 394 pg/mL (ref 232–1245)

## 2019-03-16 MED ORDER — FERROUS SULFATE 325 (65 FE) MG PO TABS: 325.0000 mg | ORAL_TABLET | Freq: Three times a day (TID) | ORAL | 3 refills | Status: DC

## 2019-03-16 NOTE — Telephone Encounter (Signed)
See lab result note 03/16/2019.

## 2019-03-26 ENCOUNTER — Other Ambulatory Visit: Payer: Self-pay | Admitting: Internal Medicine

## 2019-03-26 DIAGNOSIS — E559 Vitamin D deficiency, unspecified: Secondary | ICD-10-CM

## 2019-05-31 ENCOUNTER — Ambulatory Visit: Payer: Federal, State, Local not specified - PPO | Admitting: Internal Medicine

## 2019-12-07 ENCOUNTER — Telehealth: Payer: Self-pay | Admitting: *Deleted

## 2019-12-07 ENCOUNTER — Other Ambulatory Visit: Payer: Self-pay

## 2019-12-07 NOTE — Telephone Encounter (Signed)
Returned call to patient, she stated that she was trying to see if she could come in earlier than 11:30, offered the 7:30, patient declined, stated that she would just keep her original appointment. Patient pre-screened for appointment.  Copied from [place] [id number]. Topic: General - Other >> Dec 07, 2019  9:03 AM Keene Breath wrote: Reason for CRM: Patient is returning a screening call.  Tried the office 2x, no answer.  CB# [phone]

## 2019-12-08 ENCOUNTER — Encounter: Payer: Self-pay | Admitting: Internal Medicine

## 2019-12-08 ENCOUNTER — Ambulatory Visit: Payer: Federal, State, Local not specified - PPO | Admitting: Internal Medicine

## 2019-12-08 DIAGNOSIS — Z800 Family history of malignant neoplasm of digestive organs: Secondary | ICD-10-CM | POA: Diagnosis not present

## 2019-12-08 DIAGNOSIS — D508 Other iron deficiency anemias: Secondary | ICD-10-CM

## 2019-12-08 DIAGNOSIS — E559 Vitamin D deficiency, unspecified: Secondary | ICD-10-CM

## 2019-12-08 DIAGNOSIS — Z1239 Encounter for other screening for malignant neoplasm of breast: Secondary | ICD-10-CM | POA: Diagnosis not present

## 2019-12-08 DIAGNOSIS — Z1211 Encounter for screening for malignant neoplasm of colon: Secondary | ICD-10-CM

## 2019-12-08 MED ORDER — FERROUS SULFATE 325 (65 FE) MG PO TABS: 325.0000 mg | ORAL_TABLET | Freq: Three times a day (TID) | ORAL | 3 refills | Status: AC

## 2019-12-08 NOTE — Progress Notes (Signed)
Established Patient Office Visit     This visit occurred during the SARS-CoV-2 public health emergency.  Safety protocols were in place, including screening questions prior to the visit, additional usage of staff PPE, and extensive cleaning of exam room while observing appropriate contact time as indicated for disinfecting solutions.    CC/Reason for Visit: Discussed weight loss and cancer screening  HPI: Tracy Santos is a 49 y.o. female who is coming in today for the above mentioned reasons. Past Medical History is significant for: Iron deficiency anemia due to menometrorrhagia.  She needs refills of her iron tablets.  History is also significant for morbid obesity.  She is also concerned because her father died of colon cancer at the age of 45 and she has not yet had a colonoscopy.  She would also like to be referred for mammogram.  She has not had a physical yet with me.  Her main concern for this visit was to discuss weight loss.  She is currently at 277 pounds.  She feels like she is not motivated and feels like she needs accountability for weight loss.   Past Medical/Surgical History: Past Medical History:  Diagnosis Date  . Asthma   . Fibroid     Past Surgical History:  Procedure Laterality Date  . UTERINE FIBROID SURGERY      Social History:  reports that she has never smoked. She has never used smokeless tobacco. She reports that she does not drink alcohol or use drugs.  Allergies: No Active Allergies  Family History:  Father with colon cancer at the age of 33  Current Outpatient Medications:  .  acetaminophen (TYLENOL) 325 MG tablet, Take 650 mg by mouth every 6 (six) hours as needed., Disp: , Rfl:  .  ferrous sulfate 325 (65 FE) MG tablet, Take 1 tablet (325 mg total) by mouth 3 (three) times daily with meals., Disp: , Rfl: 3 .  meclizine (MEDI-MECLIZINE) 25 MG tablet, Take 1 tablet (25 mg total) by mouth 3 (three) times daily as needed for dizziness.,  Disp: 30 tablet, Rfl: 0 .  Multiple Vitamin (MULTIVITAMIN WITH MINERALS) TABS tablet, Take 1 tablet by mouth daily., Disp: , Rfl:   Review of Systems:  Constitutional: Denies fever, chills, diaphoresis, appetite change and fatigue.  HEENT: Denies photophobia, eye pain, redness, hearing loss, ear pain, congestion, sore throat, rhinorrhea, sneezing, mouth sores, trouble swallowing, neck pain, neck stiffness and tinnitus.   Respiratory: Denies SOB, DOE, cough, chest tightness,  and wheezing.   Cardiovascular: Denies chest pain, palpitations and leg swelling.  Gastrointestinal: Denies nausea, vomiting, abdominal pain, diarrhea, constipation, blood in stool and abdominal distention.  Genitourinary: Denies dysuria, urgency, frequency, hematuria, flank pain and difficulty urinating.  Endocrine: Denies: hot or cold intolerance, sweats, changes in hair or nails, polyuria, polydipsia. Musculoskeletal: Denies myalgias, back pain, joint swelling, arthralgias and gait problem.  Skin: Denies pallor, rash and wound.  Neurological: Denies dizziness, seizures, syncope, weakness, light-headedness, numbness and headaches.  Hematological: Denies adenopathy. Easy bruising, personal or family bleeding history  Psychiatric/Behavioral: Denies suicidal ideation, mood changes, confusion, nervousness, sleep disturbance and agitation    Physical Exam: Vitals:   12/08/19 1121  BP: 130/90  Pulse: 80  Temp: 98.2 F (36.8 C)  TempSrc: Temporal  SpO2: 97%  Weight: 277 lb 4.8 oz (125.8 kg)    Body mass index is 42.79 kg/m.   Constitutional: NAD, calm, comfortable Eyes: PERRL, lids and conjunctivae normal, wears corrective lenses ENMT: Mucous membranes are  moist.  Respiratory: clear to auscultation bilaterally, no wheezing, no crackles. Normal respiratory effort. No accessory muscle use.  Cardiovascular: Regular rate and rhythm, no murmurs / rubs / gallops. No extremity edema.  Neurologic: Grossly intact and  nonfocal Psychiatric: Normal judgment and insight. Alert and oriented x 3. Normal mood.    Impression and Plan:  Screening for malignant neoplasm of colon  - Plan: Ambulatory referral to Gastroenterology  Screening breast examination  - Plan: MM Digital Screening  Family history of colon cancer in father  - Plan: Ambulatory referral to Gastroenterology  Other iron deficiency anemia  - Plan: ferrous sulfate 325 (65 FE) MG tablet -Also concerned about possibly GI losses.  Vitamin D deficiency -Check vitamin D levels at time of physical.  Morbid obesity ([place]) -Discussed healthy lifestyle, including increased physical activity and better food choices to promote weight loss. -We have extensively discussed available diet plans and weight loss programs.  She feels like she is financially not in a place to be able to attend weight watchers or any other weight management clinic although she appreciates information on these programs. -She will download a fitness tracker on her phone while she will document weekly weights, daily food diary and daily exercise diary. -She will follow-up with me in 3 months.  We have set a goal for 12 pounds in that timeframe.    Patient Instructions  -Nice seeing you today!!  -Schedule follow up in 3 months for your physical. Please come in fasting that day.     Lelon Frohlich, MD  Primary [facility] at [facility]

## 2019-12-08 NOTE — Patient Instructions (Signed)
-  Nice seeing you today!!  -Schedule follow up in 3 months for your physical. Please come in fasting that day. 

## 2019-12-12 ENCOUNTER — Telehealth: Payer: Self-pay | Admitting: *Deleted

## 2019-12-12 ENCOUNTER — Encounter: Payer: Self-pay | Admitting: Gastroenterology

## 2019-12-12 NOTE — Telephone Encounter (Signed)
Patient called after hours line. Patient reports she had an appointment on Thursday, is it possible to have a consult to address with a doctor.  Clinic RN called to clarification. No answer. LVM for patient to return call

## 2019-12-14 ENCOUNTER — Other Ambulatory Visit: Payer: Self-pay

## 2019-12-14 ENCOUNTER — Telehealth (INDEPENDENT_AMBULATORY_CARE_PROVIDER_SITE_OTHER): Payer: Federal, State, Local not specified - PPO | Admitting: Internal Medicine

## 2019-12-14 ENCOUNTER — Encounter: Payer: Self-pay | Admitting: Internal Medicine

## 2019-12-14 VITALS — Wt 270.0 lb

## 2019-12-14 DIAGNOSIS — R6882 Decreased libido: Secondary | ICD-10-CM

## 2019-12-14 NOTE — Progress Notes (Signed)
Virtual Visit via Video Note  I connected with Tracy Santos on 12/14/19 at  2:00 PM EST by a video enabled telemedicine application and verified that I am speaking with the correct person using two identifiers.  Location patient: home Location provider: work office Persons participating in the virtual visit: patient, provider  I discussed the limitations of evaluation and management by telemedicine and the availability of in person appointments. The patient expressed understanding and agreed to proceed.   HPI: She is here today to discuss decreased libido that she failed to mention at her recent appointment.  This has been going on for months.  She would like to know what we can do to work this up.   ROS: Constitutional: Denies fever, chills, diaphoresis, appetite change and fatigue.  HEENT: Denies photophobia, eye pain, redness, hearing loss, ear pain, congestion, sore throat, rhinorrhea, sneezing, mouth sores, trouble swallowing, neck pain, neck stiffness and tinnitus.   Respiratory: Denies SOB, DOE, cough, chest tightness,  and wheezing.   Cardiovascular: Denies chest pain, palpitations and leg swelling.  Gastrointestinal: Denies nausea, vomiting, abdominal pain, diarrhea, constipation, blood in stool and abdominal distention.  Genitourinary: Denies dysuria, urgency, frequency, hematuria, flank pain and difficulty urinating.  Endocrine: Denies: hot or cold intolerance, sweats, changes in hair or nails, polyuria, polydipsia. Musculoskeletal: Denies myalgias, back pain, joint swelling, arthralgias and gait problem.  Skin: Denies pallor, rash and wound.  Neurological: Denies dizziness, seizures, syncope, weakness, light-headedness, numbness and headaches.  Hematological: Denies adenopathy. Easy bruising, personal or family bleeding history  Psychiatric/Behavioral: Denies suicidal ideation, mood changes, confusion, nervousness, sleep disturbance and agitation   Past Medical  History:  Diagnosis Date  . Asthma   . Fibroid     Past Surgical History:  Procedure Laterality Date  . UTERINE FIBROID SURGERY      No family history on file.  SOCIAL HX:   reports that she has never smoked. She has never used smokeless tobacco. She reports that she does not drink alcohol or use drugs.   Current Outpatient Medications:  .  acetaminophen (TYLENOL) 325 MG tablet, Take 650 mg by mouth every 6 (six) hours as needed., Disp: , Rfl:  .  ferrous sulfate 325 (65 FE) MG tablet, Take 1 tablet (325 mg total) by mouth 3 (three) times daily with meals., Disp: , Rfl: 3 .  meclizine (MEDI-MECLIZINE) 25 MG tablet, Take 1 tablet (25 mg total) by mouth 3 (three) times daily as needed for dizziness., Disp: 30 tablet, Rfl: 0 .  Multiple Vitamin (MULTIVITAMIN WITH MINERALS) TABS tablet, Take 1 tablet by mouth daily., Disp: , Rfl:   EXAM:   VITALS per patient if applicable: None reported  GENERAL: alert, oriented, appears well and in no acute distress  HEENT: atraumatic, conjunttiva clear, no obvious abnormalities on inspection of external nose and ears, wears corrective lenses  NECK: normal movements of the head and neck  LUNGS: on inspection no signs of respiratory distress, breathing rate appears normal, no obvious gross increased work of breathing, gasping or wheezing  CV: no obvious cyanosis  MS: moves all visible extremities without noticeable abnormality  PSYCH/NEURO: pleasant and cooperative, no obvious depression or anxiety, speech and thought processing grossly intact  ASSESSMENT AND PLAN:   Decreased libido -Discussed several potential etiologies including being perimenopausal, depression, obesity, hypothyroidism, vitamin B12 and vitamin D deficiency. -I think she is unlikely to be nearing perimenopause as she still gets her menstrual cycle every month, however she is requesting GYN referral. -  Will schedule lab appointment for her to have TSH, B12, vitamin D here  in our office. -PHQ-9 when she returns for a visit.    I discussed the assessment and treatment plan with the patient. The patient was provided an opportunity to ask questions and all were answered. The patient agreed with the plan and demonstrated an understanding of the instructions.   The patient was advised to call back or seek an in-person evaluation if the symptoms worsen or if the condition fails to improve as anticipated.    Lelon Frohlich, MD  [facility] at [facility]

## 2019-12-14 NOTE — Telephone Encounter (Signed)
Patient had a virtual visit with Dr Jerilee Hoh 12/14/19.

## 2019-12-20 ENCOUNTER — Other Ambulatory Visit: Payer: Self-pay

## 2019-12-20 ENCOUNTER — Ambulatory Visit (AMBULATORY_SURGERY_CENTER): Payer: Self-pay | Admitting: *Deleted

## 2019-12-20 VITALS — Temp 96.8°F | Ht 67.5 in | Wt 273.0 lb

## 2019-12-20 DIAGNOSIS — Z01818 Encounter for other preprocedural examination: Secondary | ICD-10-CM

## 2019-12-20 DIAGNOSIS — Z800 Family history of malignant neoplasm of digestive organs: Secondary | ICD-10-CM

## 2019-12-20 DIAGNOSIS — Z1211 Encounter for screening for malignant neoplasm of colon: Secondary | ICD-10-CM

## 2019-12-20 MED ORDER — NA SULFATE-K SULFATE-MG SULF 17.5-3.13-1.6 GM/177ML PO SOLN: ORAL | 0 refills | Status: DC

## 2019-12-20 NOTE — Progress Notes (Signed)
Patient is here in-person for PV. Patient denies any allergies to eggs or soy. Patient denies any problems with anesthesia/sedation. Patient denies any oxygen use at home. Patient denies taking any diet/weight loss medications or blood thinners. Patient is not being treated for MRSA or C-diff. EMMI education assisgned to the patient for the procedure, this was explained and instructions given to patient. COVID-19 screening test is on 2/12, the pt is aware. Pt is aware that care partner will wait in the car during procedure; if they feel like they will be too hot or cold to wait in the car; they may wait in the 4 th floor lobby. Patient is aware to bring only one care partner. We want them to wear a mask (we do not have any that we can provide them), practice social distancing, and we will check their temperatures when they get here.  I did remind the patient that their care partner needs to stay in the parking lot the entire time and have a cell phone available, we will call them when the pt is ready for discharge. Patient will wear mask into building.    Spoke with pharmacy, [place] no charge.

## 2019-12-30 ENCOUNTER — Ambulatory Visit (INDEPENDENT_AMBULATORY_CARE_PROVIDER_SITE_OTHER): Payer: Federal, State, Local not specified - PPO

## 2019-12-30 DIAGNOSIS — Z1159 Encounter for screening for other viral diseases: Secondary | ICD-10-CM

## 2020-01-02 ENCOUNTER — Other Ambulatory Visit: Payer: Self-pay | Admitting: Gastroenterology

## 2020-01-02 ENCOUNTER — Ambulatory Visit (INDEPENDENT_AMBULATORY_CARE_PROVIDER_SITE_OTHER): Payer: Federal, State, Local not specified - PPO

## 2020-01-02 DIAGNOSIS — Z1159 Encounter for screening for other viral diseases: Secondary | ICD-10-CM

## 2020-01-03 ENCOUNTER — Other Ambulatory Visit: Payer: Self-pay

## 2020-01-03 ENCOUNTER — Encounter: Payer: Self-pay | Admitting: Gastroenterology

## 2020-01-03 ENCOUNTER — Ambulatory Visit (AMBULATORY_SURGERY_CENTER): Payer: Federal, State, Local not specified - PPO | Admitting: Gastroenterology

## 2020-01-03 VITALS — BP 111/75 | HR 64 | Temp 96.8°F | Resp 14 | Ht 67.5 in | Wt 273.0 lb

## 2020-01-03 DIAGNOSIS — D128 Benign neoplasm of rectum: Secondary | ICD-10-CM | POA: Diagnosis not present

## 2020-01-03 DIAGNOSIS — Z800 Family history of malignant neoplasm of digestive organs: Secondary | ICD-10-CM

## 2020-01-03 DIAGNOSIS — Z1211 Encounter for screening for malignant neoplasm of colon: Secondary | ICD-10-CM

## 2020-01-03 LAB — SARS CORONAVIRUS 2 (TAT 6-24 HRS): SARS Coronavirus 2: NEGATIVE

## 2020-01-03 MED ORDER — SODIUM CHLORIDE 0.9 % IV SOLN: 500.0000 mL | Freq: Once | INTRAVENOUS | Status: DC

## 2020-01-03 NOTE — Progress Notes (Signed)
Pt's states no medical or surgical changes since previsit or office visit. 

## 2020-01-03 NOTE — Patient Instructions (Signed)
Please read handouts provided. Continue present medications. Await pathology results.        YOU HAD AN ENDOSCOPIC PROCEDURE TODAY AT THE [facility]:   Refer to the procedure report that was given to you for any specific questions about what was found during the examination.  If the procedure report does not answer your questions, please call your gastroenterologist to clarify.  If you requested that your care partner not be given the details of your procedure findings, then the procedure report has been included in a sealed envelope for you to review at your convenience later.  YOU SHOULD EXPECT: Some feelings of bloating in the abdomen. Passage of more gas than usual.  Walking can help get rid of the air that was put into your GI tract during the procedure and reduce the bloating. If you had a lower endoscopy (such as a colonoscopy or flexible sigmoidoscopy) you may notice spotting of blood in your stool or on the toilet paper. If you underwent a bowel prep for your procedure, you may not have a normal bowel movement for a few days.  Please Note:  You might notice some irritation and congestion in your nose or some drainage.  This is from the oxygen used during your procedure.  There is no need for concern and it should clear up in a day or so.  SYMPTOMS TO REPORT IMMEDIATELY:   Following lower endoscopy (colonoscopy or flexible sigmoidoscopy):  Excessive amounts of blood in the stool  Significant tenderness or worsening of abdominal pains  Swelling of the abdomen that is new, acute  Fever of 100F or higher    For urgent or emergent issues, a gastroenterologist can be reached at any hour by calling [phone].   DIET:  We do recommend a small meal at first, but then you may proceed to your regular diet.  Drink plenty of fluids but you should avoid alcoholic beverages for 24 hours.  ACTIVITY:  You should plan to take it easy for the rest of today and you should NOT  DRIVE or use heavy machinery until tomorrow (because of the sedation medicines used during the test).    FOLLOW UP: Our staff will call the number listed on your records 48-72 hours following your procedure to check on you and address any questions or concerns that you may have regarding the information given to you following your procedure. If we do not reach you, we will leave a message.  We will attempt to reach you two times.  During this call, we will ask if you have developed any symptoms of COVID 19. If you develop any symptoms (ie: fever, flu-like symptoms, shortness of breath, cough etc.) before then, please call [phone].  If you test positive for Covid 19 in the 2 weeks post procedure, please call and report this information to us.    If any biopsies were taken you will be contacted by phone or by letter within the next 1-3 weeks.  Please call us at [phone] if you have not heard about the biopsies in 3 weeks.    SIGNATURES/CONFIDENTIALITY: You and/or your care partner have signed paperwork which will be entered into your electronic medical record.  These signatures attest to the fact that that the information above on your After Visit Summary has been reviewed and is understood.  Full responsibility of the confidentiality of this discharge information lies with you and/or your care-partner. 

## 2020-01-03 NOTE — Progress Notes (Signed)
Report given to PACU, vss 

## 2020-01-03 NOTE — Progress Notes (Signed)
Called to room to assist during endoscopic procedure.  Patient ID and intended procedure confirmed with present staff. Received instructions for my participation in the procedure from the performing physician.  

## 2020-01-03 NOTE — Op Note (Signed)
[place] Patient Name: Tracy Santos Procedure Date: 01/03/2020 2:32 PM MRN: [id number] Endoscopist: Thornton Park MD, MD Age: 49 Referring MD:  Date of Birth: Apr 13, 1971 Gender: Female Account #: [id number] Procedure:                Colonoscopy Indications:              Screening patient at increased risk: Family history                            of 1st-degree relative with colorectal cancer at                            age 61 years (or older)                           Father with colon cancer at age 79 or 11                           This is the patient's first colonoscopy Medicines:                Monitored Anesthesia Care Procedure:                Pre-Anesthesia Assessment:                           - Prior to the procedure, a History and Physical                            was performed, and patient medications and                            allergies were reviewed. The patient's tolerance of                            previous anesthesia was also reviewed. The risks                            and benefits of the procedure and the sedation                            options and risks were discussed with the patient.                            All questions were answered, and informed consent                            was obtained. Prior Anticoagulants: The patient has                            taken no previous anticoagulant or antiplatelet                            agents. ASA Grade Assessment: II - A patient with  mild systemic disease. After reviewing the risks                            and benefits, the patient was deemed in                            satisfactory condition to undergo the procedure.                           After obtaining informed consent, the colonoscope                            was passed under direct vision. Throughout the                            procedure, the patient's blood pressure, pulse,  and                            oxygen saturations were monitored continuously. The                            Colonoscope was introduced through the anus and                            advanced to the 3 cm into the ileum. A second                            forward view of the right colon was performed. The                            colonoscopy was performed without difficulty. The                            patient tolerated the procedure well. The quality                            of the bowel preparation was good. The terminal                            ileum, ileocecal valve, appendiceal orifice, and                            rectum were photographed. Scope In: 2:42:49 PM Scope Out: 3:01:29 PM Scope Withdrawal Time: 0 hours 13 minutes 31 seconds  Total Procedure Duration: 0 hours 18 minutes 40 seconds  Findings:                 The perianal and digital rectal examinations were                            normal.                           A 2 mm polyp was found in the rectum. The polyp was  sessile. The polyp was removed with a cold snare.                            Resection and retrieval were complete. Estimated                            blood loss was minimal.                           The exam was otherwise without abnormality on                            direct and retroflexion views. Complications:            No immediate complications. Estimated blood loss:                            Minimal. Estimated Blood Loss:     Estimated blood loss was minimal. Impression:               - One 2 mm polyp in the rectum, removed with a cold                            snare. Resected and retrieved.                           - The examination was otherwise normal on direct                            and retroflexion views. Recommendation:           - Patient has a contact number available for                            emergencies. The signs and symptoms of  potential                            delayed complications were discussed with the                            patient. Return to normal activities tomorrow.                            Written discharge instructions were provided to the                            patient.                           - Resume previous diet.                           - Continue present medications.                           - Await pathology results.                           -  Repeat colonoscopy date to be determined after                            pending pathology results are reviewed for                            surveillance. Thornton Park MD, MD 01/03/2020 3:05:47 PM This report has been signed electronically.

## 2020-01-03 NOTE — Progress Notes (Signed)
JB- Temp CW- Vitals 

## 2020-01-06 ENCOUNTER — Telehealth: Payer: Self-pay

## 2020-01-06 NOTE — Telephone Encounter (Signed)
  Follow up Call-  Call back number 01/03/2020  Post procedure Call Back phone  # [phone]  Permission to leave phone message Yes  Some recent data might be hidden     Patient questions:  Do you have a fever, pain , or abdominal swelling? No. Pain Score  0 *  Have you tolerated food without any problems? Yes.    Have you been able to return to your normal activities? Yes.    Do you have any questions about your discharge instructions: Diet   No. Medications  No. Follow up visit  No.  Do you have questions or concerns about your Care?  Yes, patient said husband talked with Dr. Tarri Glenn on the phone while she was in recovery and that he was told her polyp was cancerous, so the patient is asking what he next step is.  Actions: * If pain score is 4 or above: No action needed, pain <4.  Have you developed a fever since your procedure? No 2.   Have you had an respiratory symptoms (SOB or cough) since your procedure? No 3.   Have you tested positive for COVID 19 since your procedure No 4.   Have you had any family members/close contacts diagnosed with the COVID 19 since your procedure?  No  If yes to any of these questions please route to Joylene John, RN and Alphonsa Gin, RN.

## 2020-01-06 NOTE — Telephone Encounter (Signed)
Please call the patient. She does not have cancer. I removed a small polyp that may be a precancerous polyp. We are still waiting for the results from the pathologist. If it is precancerous, she will need to have another colonoscopy in 7 years to be sure that she doesn't make more. Otherwise, her next colonoscopy should be in 10 years.

## 2020-01-06 NOTE — Telephone Encounter (Signed)
Spoke with pt and she and her husband are aware and know we will call back when path report back.

## 2020-01-09 ENCOUNTER — Encounter: Payer: Self-pay | Admitting: Gastroenterology

## 2020-01-31 ENCOUNTER — Ambulatory Visit
Admission: RE | Admit: 2020-01-31 | Discharge: 2020-01-31 | Disposition: A | Discharge status: Home / Self Care | Payer: Federal, State, Local not specified - PPO | Source: Ambulatory Visit | Attending: Internal Medicine | Admitting: Internal Medicine

## 2020-01-31 ENCOUNTER — Other Ambulatory Visit: Payer: Self-pay

## 2020-01-31 DIAGNOSIS — Z1239 Encounter for other screening for malignant neoplasm of breast: Secondary | ICD-10-CM

## 2020-01-31 DIAGNOSIS — Z1231 Encounter for screening mammogram for malignant neoplasm of breast: Secondary | ICD-10-CM | POA: Diagnosis not present

## 2020-03-20 ENCOUNTER — Other Ambulatory Visit: Payer: Self-pay

## 2020-03-21 ENCOUNTER — Encounter: Payer: Self-pay | Admitting: Nurse Practitioner

## 2020-03-21 ENCOUNTER — Ambulatory Visit: Payer: Federal, State, Local not specified - PPO | Admitting: Nurse Practitioner

## 2020-03-21 VITALS — BP 132/80 | Ht 67.0 in | Wt 263.0 lb

## 2020-03-21 DIAGNOSIS — Z01419 Encounter for gynecological examination (general) (routine) without abnormal findings: Secondary | ICD-10-CM

## 2020-03-21 DIAGNOSIS — N921 Excessive and frequent menstruation with irregular cycle: Secondary | ICD-10-CM | POA: Diagnosis not present

## 2020-03-21 DIAGNOSIS — D259 Leiomyoma of uterus, unspecified: Secondary | ICD-10-CM

## 2020-03-21 DIAGNOSIS — N83202 Unspecified ovarian cyst, left side: Secondary | ICD-10-CM | POA: Diagnosis not present

## 2020-03-21 DIAGNOSIS — Z6841 Body Mass Index (BMI) 40.0 and over, adult: Secondary | ICD-10-CM

## 2020-03-21 NOTE — Progress Notes (Signed)
   Tracy Santos [birth year] [id number]   History:  49 y.o. MBF G3 P2 presents with complaints of newly irregular cycles.  This began a few months ago and prior to this they were always regular.  Not on contraception.  Sexually active with husband, complains of decreased libido.  Interested in a weight loss program.  Father with history of colon cancer at age 49.  Labs done 12/14/2019.  History of uterine fibroid and polyp removal.  Ultrasound 06/16/2017 showed several fibroids, largest 3.8 cm and a left ovarian cyst measuring 3.3 cm.  Denies menopausal symptoms.  Gynecologic History Patient's last menstrual period was 03/17/2020. Period Pattern: (!) Irregular Menstrual Flow: Light, Moderate Menstrual Control: Maxi pad Dysmenorrhea: (!) Mild Dysmenorrhea Symptoms: Cramping Contraception: none Last Pap: 2018 per patient. Results were: normal Last mammogram: 01/31/2020. Results were: normal Colonoscopy: 01/03/20. Results: 19mm rectal polyp  Past medical history, past surgical history, family history and social history were all reviewed and documented in the EPIC chart.  ROS:  A ROS was performed and pertinent positives and negatives are included.  Exam:  Vitals:   03/21/20 1134  BP: 132/80  Weight: 263 lb (119.3 kg)  Height: 5\' 7"  (1.702 m)   Body mass index is 41.19 kg/m.  General appearance:  Normal Thyroid:  Symmetrical, normal in size, without palpable masses or nodularity. Respiratory  Auscultation:  Clear without wheezing or rhonchi Cardiovascular  Auscultation:  Regular rate, without rubs, murmurs or gallops  Edema/varicosities:  Not grossly evident Abdominal  Soft,nontender, without masses, guarding or rebound.  Liver/spleen:  No organomegaly noted  Hernia:  None appreciated  Skin  Inspection:  Grossly normal   Breasts: Examined lying and sitting.   Right: Without masses, retractions, discharge or axillary adenopathy.   Left: Without masses, retractions,  discharge or axillary adenopathy. Gentitourinary   Inguinal/mons:  Normal without inguinal adenopathy  External genitalia:  Normal  BUS/Urethra/Skene's glands:  Normal  Vagina:  Normal  Cervix:  Normal  Uterus:  Anteverted, normal in size, shape and contour.  Midline and mobile  Adnexa/parametria:     Rt: Without masses or tenderness.   Lt: Without masses or tenderness.  Anus and perineum: Normal    Assessment/Plan:  49 y.o. MBF G3P2 presents for the above probles.   Well female exam with routine gynecological exam Education provided on SBEs, importance of preventative screenings, current guidelines, high calcium diet, regular exercise, multivitamin daily, and weight loss management. Pap with HR HPV pending  Menorrhagia with irregular cycle - Plan: US PELVIS TRANSVAGINAL NON-OB (TV ONLY)  Cyst of left ovary - Plan: US PELVIS TRANSVAGINAL NON-OB (TV ONLY)  Leiomyoma of body of uterus - Plan: US PELVIS TRANSVAGINAL NON-OB (TV ONLY)  Obesity, BMI 41.19-Referral to weight loss clinic  Follow up in 1 year for annual    [place], 11:42 AM 03/21/2020

## 2020-03-21 NOTE — Addendum Note (Signed)
Addended by: Lorine Bears on: 03/21/2020 03:31 PM   Modules accepted: Orders

## 2020-03-21 NOTE — Patient Instructions (Signed)
Health Maintenance, Female Adopting a healthy lifestyle and getting preventive care are important in promoting health and wellness. Ask your health care provider about:  The right schedule for you to have regular tests and exams.  Things you can do on your own to prevent diseases and keep yourself healthy. What should I know about diet, weight, and exercise? Eat a healthy diet   Eat a diet that includes plenty of vegetables, fruits, low-fat dairy products, and lean protein.  Do not eat a lot of foods that are high in solid fats, added sugars, or sodium. Maintain a healthy weight Body mass index (BMI) is used to identify weight problems. It estimates body fat based on height and weight. Your health care provider can help determine your BMI and help you achieve or maintain a healthy weight. Get regular exercise Get regular exercise. This is one of the most important things you can do for your health. Most adults should:  Exercise for at least 150 minutes each week. The exercise should increase your heart rate and make you sweat (moderate-intensity exercise).  Do strengthening exercises at least twice a week. This is in addition to the moderate-intensity exercise.  Spend less time sitting. Even light physical activity can be beneficial. Watch cholesterol and blood lipids Have your blood tested for lipids and cholesterol at 50 years of age, then have this test every 5 years. Have your cholesterol levels checked more often if:  Your lipid or cholesterol levels are high.  You are older than 50 years of age.  You are at high risk for heart disease. What should I know about cancer screening? Depending on your health history and family history, you may need to have cancer screening at various ages. This may include screening for:  Breast cancer.  Cervical cancer.  Colorectal cancer.  Skin cancer.  Lung cancer. What should I know about heart disease, diabetes, and high blood  pressure? Blood pressure and heart disease  High blood pressure causes heart disease and increases the risk of stroke. This is more likely to develop in people who have high blood pressure readings, are of African descent, or are overweight.  Have your blood pressure checked: ? Every 3-5 years if you are 18-39 years of age. ? Every year if you are 40 years old or older. Diabetes Have regular diabetes screenings. This checks your fasting blood sugar level. Have the screening done:  Once every three years after age 40 if you are at a normal weight and have a low risk for diabetes.  More often and at a younger age if you are overweight or have a high risk for diabetes. What should I know about preventing infection? Hepatitis B If you have a higher risk for hepatitis B, you should be screened for this virus. Talk with your health care provider to find out if you are at risk for hepatitis B infection. Hepatitis C Testing is recommended for:  Everyone born from 1945 through 1965.  Anyone with known risk factors for hepatitis C. Sexually transmitted infections (STIs)  Get screened for STIs, including gonorrhea and chlamydia, if: ? You are sexually active and are younger than 50 years of age. ? You are older than 50 years of age and your health care provider tells you that you are at risk for this type of infection. ? Your sexual activity has changed since you were last screened, and you are at increased risk for chlamydia or gonorrhea. Ask your health care provider if   you are at risk.  Ask your health care provider about whether you are at high risk for HIV. Your health care provider may recommend a prescription medicine to help prevent HIV infection. If you choose to take medicine to prevent HIV, you should first get tested for HIV. You should then be tested every 3 months for as long as you are taking the medicine. Pregnancy  If you are about to stop having your period (premenopausal) and  you may become pregnant, seek counseling before you get pregnant.  Take 400 to 800 micrograms (mcg) of folic acid every day if you become pregnant.  Ask for birth control (contraception) if you want to prevent pregnancy. Osteoporosis and menopause Osteoporosis is a disease in which the bones lose minerals and strength with aging. This can result in bone fractures. If you are 65 years old or older, or if you are at risk for osteoporosis and fractures, ask your health care provider if you should:  Be screened for bone loss.  Take a calcium or vitamin D supplement to lower your risk of fractures.  Be given hormone replacement therapy (HRT) to treat symptoms of menopause. Follow these instructions at home: Lifestyle  Do not use any products that contain nicotine or tobacco, such as cigarettes, e-cigarettes, and chewing tobacco. If you need help quitting, ask your health care provider.  Do not use street drugs.  Do not share needles.  Ask your health care provider for help if you need support or information about quitting drugs. Alcohol use  Do not drink alcohol if: ? Your health care provider tells you not to drink. ? You are pregnant, may be pregnant, or are planning to become pregnant.  If you drink alcohol: ? Limit how much you use to 0-1 drink a day. ? Limit intake if you are breastfeeding.  Be aware of how much alcohol is in your drink. In the [place] one drink equals one 12 oz bottle of beer (355 mL), one 5 oz glass of wine (148 mL), or one 1 oz glass of hard liquor (44 mL). General instructions  Schedule regular health, dental, and eye exams.  Stay current with your vaccines.  Tell your health care provider if: ? You often feel depressed. ? You have ever been abused or do not feel safe at home. Summary  Adopting a healthy lifestyle and getting preventive care are important in promoting health and wellness.  Follow your health care provider's instructions about healthy  diet, exercising, and getting tested or screened for diseases.  Follow your health care provider's instructions on monitoring your cholesterol and blood pressure. This information is not intended to replace advice given to you by your health care provider. Make sure you discuss any questions you have with your health care provider. Document Revised: 10/27/2018 Document Reviewed: 10/27/2018 Elsevier Patient Education  2020 Elsevier Inc.  

## 2020-03-22 ENCOUNTER — Telehealth: Payer: Self-pay | Admitting: *Deleted

## 2020-03-22 NOTE — Telephone Encounter (Signed)
I called patient she will need to register for classes before appointment with surgeon. May do this via [email or web address] or call [phone]. Left message for patient to call to relay.

## 2020-03-22 NOTE — Telephone Encounter (Signed)
-----   Message from Tamela Gammon, NP sent at 03/21/2020 12:26 PM EDT ----- Patient would like a referral to a bariatric clinic for weight loss management. Thank you

## 2020-03-24 LAB — PAP, TP IMAGING W/ HPV RNA, RFLX HPV TYPE 16,18/45: HPV DNA High Risk: NOT DETECTED

## 2020-04-10 ENCOUNTER — Other Ambulatory Visit: Payer: Self-pay

## 2020-04-11 ENCOUNTER — Encounter: Payer: Self-pay | Admitting: Nurse Practitioner

## 2020-04-11 ENCOUNTER — Ambulatory Visit (INDEPENDENT_AMBULATORY_CARE_PROVIDER_SITE_OTHER): Payer: Federal, State, Local not specified - PPO

## 2020-04-11 ENCOUNTER — Ambulatory Visit: Payer: Federal, State, Local not specified - PPO | Admitting: Nurse Practitioner

## 2020-04-11 VITALS — BP 124/80

## 2020-04-11 DIAGNOSIS — N921 Excessive and frequent menstruation with irregular cycle: Secondary | ICD-10-CM

## 2020-04-11 DIAGNOSIS — D259 Leiomyoma of uterus, unspecified: Secondary | ICD-10-CM

## 2020-04-11 DIAGNOSIS — N840 Polyp of corpus uteri: Secondary | ICD-10-CM

## 2020-04-11 DIAGNOSIS — N83202 Unspecified ovarian cyst, left side: Secondary | ICD-10-CM | POA: Diagnosis not present

## 2020-04-11 DIAGNOSIS — R9389 Abnormal findings on diagnostic imaging of other specified body structures: Secondary | ICD-10-CM

## 2020-04-11 NOTE — Progress Notes (Signed)
History: Patient presents following pelvic ultrasound today.  Was seen by me 03/27/2020 with complaints of newly irregular cycles that began a few months ago and prior to this were always regular.  Not on contraception.  History of uterine fibroid and polyp removal.  Ultrasound 06/16/2017 showed several fibroids, largest 3.8 cm and a left ovarian cyst measuring 3.3 cm. Also complains of urinary urgency that is not new for her. Denies dysuria and frequency.   Exam: Appears normal Ultrasound: Anteverted enlarged uterus with several fibroids, overall uterine size of uterus increased to 13 x 8.5 x 8.0 cm, size of largest central fibroid increased to 4.2 x 3.0 cm.  Endometrial cavity distorted by central fibroid, small amount of fluid in cavity outlines a 13 x 6 mm intracavitary mass along the left sidewall, feeder vessel identified to mass (probable polyp).  Both ovaries normal size with normal follicle pattern.  No adnexal masses, no free fluid.  Assessment: Endometrial polyp Leiomyoma of body of uterus Menorrhagia with irregular cycle Urinary Urgency  Plan: Will schedule hysteroscopy for polyp removal. Will see if cycles regulate following removal. If not, we discussed hormonal contraceptive options for treatment of heavy, irregular cycles. Referral for pelvic floor PT for urinary urgency. Information provided on weight loss clinic.

## 2020-04-11 NOTE — Patient Instructions (Signed)
[facility] [address] [phone]

## 2020-04-12 ENCOUNTER — Telehealth: Payer: Self-pay | Admitting: *Deleted

## 2020-04-12 ENCOUNTER — Encounter: Payer: Self-pay | Admitting: *Deleted

## 2020-04-12 DIAGNOSIS — N8189 Other female genital prolapse: Secondary | ICD-10-CM

## 2020-04-12 NOTE — Telephone Encounter (Signed)
-----   Message from Tracy Gammon, NP sent at 04/11/2020  4:32 PM EDT ----- Pt would like referral to PT for pelvic floor strengthening for urinary urgency. Thank you

## 2020-04-12 NOTE — Telephone Encounter (Signed)
Referral placed at [facility] they will call to schedule. 

## 2020-04-24 NOTE — Telephone Encounter (Signed)
[place] has left message x 2 for patient to call. This encounter will be closed.

## 2020-05-16 DIAGNOSIS — K080 Exfoliation of teeth due to systemic causes: Secondary | ICD-10-CM | POA: Diagnosis not present

## 2020-06-28 DIAGNOSIS — M25562 Pain in left knee: Secondary | ICD-10-CM | POA: Diagnosis not present

## 2020-07-25 DIAGNOSIS — Z20822 Contact with and (suspected) exposure to covid-19: Secondary | ICD-10-CM | POA: Diagnosis not present

## 2020-07-25 DIAGNOSIS — Z03818 Encounter for observation for suspected exposure to other biological agents ruled out: Secondary | ICD-10-CM | POA: Diagnosis not present

## 2020-08-27 ENCOUNTER — Telehealth: Payer: Self-pay

## 2020-08-27 ENCOUNTER — Other Ambulatory Visit: Payer: Self-pay

## 2020-08-27 DIAGNOSIS — N939 Abnormal uterine and vaginal bleeding, unspecified: Secondary | ICD-10-CM

## 2020-08-27 DIAGNOSIS — N840 Polyp of corpus uteri: Secondary | ICD-10-CM

## 2020-08-27 NOTE — Telephone Encounter (Signed)
Received staff message from TW: "This patient needs a sonohysterogram with Dr. Delilah Shan. I was looking back from her appt in May and do not see where I sent a message about this. "  I called patient and spoke with her and explained that TW recommended this procedure. She does want a female physician so I will let appt desk know to schedule with ML.  I explained it is schedule with her cycle. I sent CLaudia a message to please call her to arrange.

## 2020-08-29 ENCOUNTER — Other Ambulatory Visit: Payer: Self-pay

## 2020-08-29 ENCOUNTER — Encounter: Payer: Self-pay | Admitting: Nurse Practitioner

## 2020-08-29 ENCOUNTER — Telehealth (INDEPENDENT_AMBULATORY_CARE_PROVIDER_SITE_OTHER): Payer: Federal, State, Local not specified - PPO | Admitting: Nurse Practitioner

## 2020-08-29 DIAGNOSIS — R6882 Decreased libido: Secondary | ICD-10-CM | POA: Diagnosis not present

## 2020-08-29 NOTE — Progress Notes (Addendum)
I connected with  Tracy Santos on 08/29/20 by a video enabled telemedicine application and verified that I am speaking with the correct person using two identifiers. Provider is located at [facility] in a private office and patient is at home. I discussed the limitations of evaluation and management by telemedicine. The patient expressed understanding and agreed to proceed. The interaction lasted 20 minutes.   History: 49 year old [id number] presents for visit by video call for complaints of low libido. She had difficulty connecting her video so we spoke by telephone. Says she has had a decrease in her sex drive since her 63O/75I and has no desire for intercourse. Once started she finds it enjoyable and does experience pleasure. She has a good relationship with her husband. She has had pain with intercourse due to vaginal dryness and after trying multiple products has found something OTC that has been helping some. She is asking if she should have her hormone levels checked and if there is any medication that can help. She is still having regular cycles without menopausal symptoms. She has also been wanting to lose weight and at her last visit we discussed visiting a weight loss clinic but at this time she has not followed up. She also experiences urinary urgency and pelvic floor weakness. We sent a referral for PT but they were unable to get in contact with her to make appointment.   Exam: Sounds well Assessment: Low libido  Plan: We discussed her history of decreased libido and expectations of her husband. She wishes to have the desire to have intercourse more often. I informed her that because she is still premenopausal checking her estrogen and progesterone would not be effective and research does not show improvement in sex drive with estrogen and/or progesterone therapy. I did offer to check her testosterone and if significantly low we would consider treatment but I also let  her know that I do not recommend this and there are many side effects to taking testosterone such as acne, hair growth, and deepening of voice. There are no FDA approved medications at this time for sexual dysfunction in women. She admits that many times she is tired and she would like to lose weight. I did mention [facility] for weight loss management and/or hormone management and also still recommend her do pelvic floor PT. I also recommended coconut oil for vaginal dryness/dyspareunia and to use as lubricant during intercourse, try being the initiator when she wants and planning intercourse to allow her to think about it throughout the day so she has more desire. She is agreeable to plan.

## 2020-09-03 ENCOUNTER — Telehealth: Payer: Self-pay

## 2020-09-03 NOTE — Telephone Encounter (Signed)
Staff message from TW "She is requesting the [facility] be done by a female provider. Can we see if Dr. Dellis Filbert can do this instead?"  Rosemarie Ax has called patient twice and left message both times to arrange this for her.

## 2020-09-20 ENCOUNTER — Ambulatory Visit (INDEPENDENT_AMBULATORY_CARE_PROVIDER_SITE_OTHER): Payer: Federal, State, Local not specified - PPO

## 2020-09-20 ENCOUNTER — Other Ambulatory Visit: Payer: Self-pay

## 2020-09-20 ENCOUNTER — Other Ambulatory Visit: Payer: Self-pay | Admitting: Obstetrics & Gynecology

## 2020-09-20 ENCOUNTER — Ambulatory Visit: Payer: Federal, State, Local not specified - PPO | Admitting: Obstetrics & Gynecology

## 2020-09-20 DIAGNOSIS — N840 Polyp of corpus uteri: Secondary | ICD-10-CM

## 2020-09-20 DIAGNOSIS — R9389 Abnormal findings on diagnostic imaging of other specified body structures: Secondary | ICD-10-CM

## 2020-09-20 DIAGNOSIS — N939 Abnormal uterine and vaginal bleeding, unspecified: Secondary | ICD-10-CM

## 2020-09-20 DIAGNOSIS — D250 Submucous leiomyoma of uterus: Secondary | ICD-10-CM | POA: Diagnosis not present

## 2020-09-20 DIAGNOSIS — N920 Excessive and frequent menstruation with regular cycle: Secondary | ICD-10-CM | POA: Diagnosis not present

## 2020-09-20 DIAGNOSIS — N854 Malposition of uterus: Secondary | ICD-10-CM

## 2020-09-20 NOTE — Progress Notes (Signed)
Opel Lejeune [birth year] [id number]        49 y.o.  [id number]   RP: Menorrhagia with Fibroid/possible Endometrial Polyp for Sonohysto  HPI: Menorrhagia with Pelvic US 03/2020 showing a 4.2 cm central fibroid and a probable endometrial polyp.   OB History  Gravida Para Term Preterm AB Living  3 2 2   1 2   SAB TAB Ectopic Multiple Live Births  0   0   2    # Outcome Date GA Lbr Len/2nd Weight Sex Delivery Anes PTL Lv  3 Term      Vag-Spont   LIV  2 Term      Vag-Spont   LIV  1 AB             Past medical history,surgical history, problem list, medications, allergies, family history and social history were all reviewed and documented in the EPIC chart.   Directed ROS with pertinent positives and negatives documented in the history of present illness/assessment and plan.  Exam:  There were no vitals filed for this visit. General appearance:  Normal                                                                    Sono Infusion Hysterogram ( procedure note)   The initial transvaginal ultrasound demonstrated the following: T/V images.  Anteverted uterus enlarged with multiple fibroids.  The overall uterine size is measured at 11.15 x 8.63 x 7.9 cm.  The fibroids are measured at 4.16 x 3.13, 4.4 x 3.87, 1.88 x 1.25 cm.  The endometrial lining is measured at 6.81 mm.  Both ovaries are normal in size with normal follicular pattern time.  The left ovary shows 2 collapsed corpus luteum cysts both measured at 1.4 cm.  No adnexal mass.  Mild clear free fluid in the posterior cul-de-sac.  The speculum  was inserted and the cervix cleansed with Betadine solution after confirming that patient has no allergies.A small sonohysterography catheterwas utilized.  Insertion was facilitated with ring forceps, using a spear-like motion the catheter was inserted to the fundus of the uterus. The speculum is then removed carefully to avoid dislodging the catheter. The catheter was flushed with  sterile saline delete prior to insertion to rid it of small amounts of air.the sterile saline solution was infused into the uterine cavity as a vaginal ultrasound probe was then placed in the vagina for full visualization of the uterine cavity from a transvaginal approach. The following was noted: Sonohysto is performed using 0.9% normal saline.  After normal saline was introduced in the uterine cavity, a polyp was seen and a submucosal fibroid measured at 4 cm.  The catheter was then removed after retrieving some of the saline from the intrauterine cavity. An endometrial biopsy was not done. Patient tolerated procedure well. She had received a tablet of Aleve for discomfort.    Assessment/Plan:  49 y.o. [id number]   1. Menorrhagia with regular cycle Menorrhagia associated with a submucosal myoma measured at 4 cm and a probable endometrial polyp confirmed by sonohysterogram.  Decision to proceed with a hysteroscopy with XL Meyer sure excision and D&C.  The possibility of using the new instruments on nighttime if available mention to the patient.  Pamphlet on hysteroscopy  given.  Follow-up preop visit.  We will do a CBC today to rule out anemia. - CBC  2. Fibroids, submucosal As above.  3. Endometrial polyp As above.  Princess Bruins MD, 3:10 PM 09/20/2020

## 2020-09-21 ENCOUNTER — Encounter: Payer: Self-pay | Admitting: Obstetrics & Gynecology

## 2020-09-21 LAB — CBC
HCT: 32.4 % — ABNORMAL LOW (ref 35.0–45.0)
Hemoglobin: 9.7 g/dL — ABNORMAL LOW (ref 11.7–15.5)
MCH: 22.9 pg — ABNORMAL LOW (ref 27.0–33.0)
MCHC: 29.9 g/dL — ABNORMAL LOW (ref 32.0–36.0)
MCV: 76.4 fL — ABNORMAL LOW (ref 80.0–100.0)
MPV: 10.6 fL (ref 7.5–12.5)
Platelets: 351 10*3/uL (ref 140–400)
RBC: 4.24 10*6/uL (ref 3.80–5.10)
RDW: 15.7 % — ABNORMAL HIGH (ref 11.0–15.0)
WBC: 4.1 10*3/uL (ref 3.8–10.8)

## 2020-09-24 ENCOUNTER — Telehealth: Payer: Self-pay

## 2020-09-24 NOTE — Telephone Encounter (Signed)
I spoke with patient about insurance benefits for surgery and her estimated [facility] surgery prepayment due by one week prior to surgery.   We discussed upcoming available dates. She wants to confer with her husband and call me back to schedule.  We discussed need for Covid testing 4 days prior to surgery and quarantine protocol for after surgery.

## 2021-02-12 ENCOUNTER — Other Ambulatory Visit: Payer: Self-pay

## 2021-02-12 ENCOUNTER — Emergency Department (HOSPITAL_COMMUNITY): Payer: Federal, State, Local not specified - PPO

## 2021-02-12 ENCOUNTER — Emergency Department (HOSPITAL_COMMUNITY)
Admission: EM | Admit: 2021-02-12 | Discharge: 2021-02-12 | Disposition: A | Discharge status: Home / Self Care | Payer: Federal, State, Local not specified - PPO | Attending: Emergency Medicine | Admitting: Emergency Medicine

## 2021-02-12 ENCOUNTER — Encounter (HOSPITAL_COMMUNITY): Payer: Self-pay

## 2021-02-12 DIAGNOSIS — J45909 Unspecified asthma, uncomplicated: Secondary | ICD-10-CM | POA: Diagnosis not present

## 2021-02-12 DIAGNOSIS — M1712 Unilateral primary osteoarthritis, left knee: Secondary | ICD-10-CM | POA: Diagnosis not present

## 2021-02-12 DIAGNOSIS — M25562 Pain in left knee: Secondary | ICD-10-CM | POA: Diagnosis not present

## 2021-02-12 DIAGNOSIS — M1711 Unilateral primary osteoarthritis, right knee: Secondary | ICD-10-CM | POA: Insufficient documentation

## 2021-02-12 MED ORDER — DICLOFENAC SODIUM 1 % EX GEL: 2.0000 g | Freq: Four times a day (QID) | CUTANEOUS | 0 refills | Status: DC

## 2021-02-12 NOTE — ED Triage Notes (Signed)
Patient complains of left knee pain with no known injury. Pain worse with any ambulation. NAD

## 2021-02-12 NOTE — ED Notes (Signed)
No swelling noted to left knee. Pt was able to stand. Pt denies numbness and tingling. Pt has 2+ left pedal pulse, cap refill less than 3 sec, warm to touch.

## 2021-02-12 NOTE — ED Provider Notes (Signed)
[facility] Provider Note   CSN: [id number] Arrival date & time: 02/12/21  1113     History No chief complaint on file.   Tracy Santos is a 50 y.o. female.  50 year old female presents with complaint of bilateral, left worse than right knee pain, worse with bearing weight.  Patient states that she twisted her knee a few months ago but felt the injury was insignificant compared to how long her knee has been hurting her.  Patient has been trying to stay active with walking however is limited by her left knee pain.  States the pain feels deep in her knee, has started taking aspirin daily which she thinks is helping.  Patient went to urgent care previously and was told she had arthritis.  No other complaints or concerns.        Past Medical History:  Diagnosis Date  . Asthma   . Fibroid     Patient Active Problem List   Diagnosis Date Noted  . Iron deficiency anemia 03/08/2019  . Vitamin D deficiency 03/08/2019    Past Surgical History:  Procedure Laterality Date  . UTERINE FIBROID SURGERY       OB History    Gravida  3   Para  2   Term  2   Preterm      AB  1   Living  2     SAB  0   IAB      Ectopic  0   Multiple      Live Births  2           Family History  Problem Relation Age of Onset  . Colon cancer Father 89  . Colon polyps Neg Hx   . Esophageal cancer Neg Hx   . Rectal cancer Neg Hx   . Stomach cancer Neg Hx     Social History   Tobacco Use  . Smoking status: Never Smoker  . Smokeless tobacco: Never Used  Vaping Use  . Vaping Use: Never used  Substance Use Topics  . Alcohol use: No  . Drug use: No    Home Medications Prior to Admission medications   Medication Sig Start Date End Date Taking? Authorizing Provider  diclofenac Sodium (VOLTAREN) 1 % GEL Apply 2 g topically 4 (four) times daily. 02/12/21  Yes Tacy Learn, PA-C  acetaminophen (TYLENOL) 325 MG tablet Take 650 mg  by mouth every 6 (six) hours as needed.    [provider]  ferrous sulfate 325 (65 FE) MG tablet Take 1 tablet (325 mg total) by mouth 3 (three) times daily with meals. 12/08/19   Isaac Bliss, Rayford Halsted, MD  meclizine (MEDI-MECLIZINE) 25 MG tablet Take 1 tablet (25 mg total) by mouth 3 (three) times daily as needed for dizziness. 03/08/19   Isaac Bliss, Rayford Halsted, MD  Multiple Vitamin (MULTIVITAMIN WITH MINERALS) TABS tablet Take 1 tablet by mouth daily.    [provider]    Allergies    Patient has no known allergies.  Review of Systems   Review of Systems  Constitutional: Negative for fever.  Musculoskeletal: Positive for arthralgias and gait problem. Negative for joint swelling and myalgias.  Skin: Negative for color change, rash and wound.  Allergic/Immunologic: Negative for immunocompromised state.  Neurological: Negative for weakness and numbness.    Physical Exam Updated Vital Signs BP 127/85   Pulse 68   Temp (!) 97 F (36.1 C) (Temporal)   Resp  15   LMP 02/05/2021 (Approximate)   SpO2 99%   Physical Exam Vitals and nursing note reviewed.  Constitutional:      General: She is not in acute distress.    Appearance: She is well-developed. She is obese. She is not diaphoretic.  HENT:     Head: Normocephalic and atraumatic.  Cardiovascular:     Pulses: Normal pulses.  Pulmonary:     Effort: Pulmonary effort is normal.  Musculoskeletal:        General: No swelling, tenderness, deformity or signs of injury. Normal range of motion.     Left knee: Crepitus present. No swelling, deformity, effusion, erythema, ecchymosis or bony tenderness. Normal range of motion. No tenderness. No LCL laxity or MCL laxity.Normal alignment, normal meniscus and normal patellar mobility. Normal pulse.     Right lower leg: No edema.     Left lower leg: No edema.  Skin:    General: Skin is warm and dry.     Findings: No erythema or rash.  Neurological:     Mental  Status: She is alert and oriented to person, place, and time.     Sensory: No sensory deficit.     Motor: No weakness.  Psychiatric:        Behavior: Behavior normal.     ED Results / Procedures / Treatments   Labs (all labs ordered are listed, but only abnormal results are displayed) Labs Reviewed - No data to display  EKG None  Radiology DG Knee Complete 4 Views Left  Result Date: 02/12/2021 CLINICAL DATA:  LEFT knee pain EXAM: LEFT KNEE - COMPLETE 4+ VIEW COMPARISON:  None. FINDINGS: No fracture of the proximal tibia or distal femur. Patella is normal. No joint effusion. Degenerate spurring along the medial compartment IMPRESSION: No acute arthropathy or infection.  Mild degenerative change Electronically Signed   By: Suzy Bouchard M.D.   On: 02/12/2021 12:38    Procedures Procedures   Medications Ordered in ED Medications - No data to display  ED Course  I have reviewed the triage vital signs and the nursing notes.  Pertinent labs & imaging results that were available during my care of the patient were reviewed by me and considered in my medical decision making (see chart for details).  Clinical Course as of 02/12/21 1306  Tue Feb 13, 4820  4639 51 year old female with L>R knee pain, worse with weight bearing. Reports minor knee injury previously. Prior XR with arthritic changes.  Mild crepitus without obvious effusion, laxity, or overlying erythema.  XR today with mild degenerative changes.  Patient does not like to take pills, will try topical diclofenac and refer to ortho for follow up. Recommend light activity, swimming/walking/stationary bike, healthy weight loss for long term joint health.  [LM]    Clinical Course User Index [LM] Roque Lias   MDM Rules/Calculators/A&P                         Final Clinical Impression(s) / ED Diagnoses Final diagnoses:  Osteoarthritis of left knee, unspecified osteoarthritis type    Rx / DC Orders ED Discharge  Orders         Ordered    diclofenac Sodium (VOLTAREN) 1 % GEL  4 times daily        02/12/21 1250           Tacy Learn, PA-C 02/12/21 1307    Sherwood Gambler, MD 02/13/21 1515

## 2021-02-12 NOTE — Discharge Instructions (Addendum)
Your x-ray today shows mild arthritis. Apply diclofenac topically as discussed and follow-up with orthopedics.

## 2021-04-02 ENCOUNTER — Ambulatory Visit: Payer: Federal, State, Local not specified - PPO | Admitting: Podiatry

## 2021-04-02 DIAGNOSIS — M79671 Pain in right foot: Secondary | ICD-10-CM | POA: Diagnosis not present

## 2021-04-02 DIAGNOSIS — M722 Plantar fascial fibromatosis: Secondary | ICD-10-CM | POA: Diagnosis not present

## 2021-04-02 DIAGNOSIS — M71571 Other bursitis, not elsewhere classified, right ankle and foot: Secondary | ICD-10-CM | POA: Diagnosis not present

## 2021-04-10 DIAGNOSIS — F4323 Adjustment disorder with mixed anxiety and depressed mood: Secondary | ICD-10-CM | POA: Diagnosis not present

## 2021-04-12 DIAGNOSIS — F4323 Adjustment disorder with mixed anxiety and depressed mood: Secondary | ICD-10-CM | POA: Diagnosis not present

## 2021-04-16 DIAGNOSIS — M7731 Calcaneal spur, right foot: Secondary | ICD-10-CM | POA: Diagnosis not present

## 2021-04-16 DIAGNOSIS — M71571 Other bursitis, not elsewhere classified, right ankle and foot: Secondary | ICD-10-CM | POA: Diagnosis not present

## 2021-04-16 DIAGNOSIS — M722 Plantar fascial fibromatosis: Secondary | ICD-10-CM | POA: Diagnosis not present

## 2021-04-26 DIAGNOSIS — F4323 Adjustment disorder with mixed anxiety and depressed mood: Secondary | ICD-10-CM | POA: Diagnosis not present

## 2021-05-01 ENCOUNTER — Ambulatory Visit: Payer: Federal, State, Local not specified - PPO | Admitting: Obstetrics & Gynecology

## 2021-06-27 ENCOUNTER — Encounter: Payer: Self-pay | Admitting: Sports Medicine

## 2021-06-27 ENCOUNTER — Other Ambulatory Visit: Payer: Self-pay | Admitting: Sports Medicine

## 2021-06-27 ENCOUNTER — Ambulatory Visit (INDEPENDENT_AMBULATORY_CARE_PROVIDER_SITE_OTHER): Payer: Federal, State, Local not specified - PPO

## 2021-06-27 ENCOUNTER — Other Ambulatory Visit: Payer: Self-pay

## 2021-06-27 ENCOUNTER — Ambulatory Visit: Payer: Federal, State, Local not specified - PPO | Admitting: Sports Medicine

## 2021-06-27 DIAGNOSIS — M722 Plantar fascial fibromatosis: Secondary | ICD-10-CM

## 2021-06-27 DIAGNOSIS — M7661 Achilles tendinitis, right leg: Secondary | ICD-10-CM

## 2021-06-27 DIAGNOSIS — M216X1 Other acquired deformities of right foot: Secondary | ICD-10-CM

## 2021-06-27 DIAGNOSIS — M79671 Pain in right foot: Secondary | ICD-10-CM

## 2021-06-27 MED ORDER — DICLOFENAC SODIUM 75 MG PO TBEC: 75.0000 mg | DELAYED_RELEASE_TABLET | Freq: Two times a day (BID) | ORAL | 0 refills | Status: DC

## 2021-06-27 MED ORDER — TRIAMCINOLONE ACETONIDE 10 MG/ML IJ SUSP: 10.0000 mg | Freq: Once | INTRAMUSCULAR | Status: AC

## 2021-06-27 NOTE — Patient Instructions (Signed)
EPAT/Shock wave therapy (more info on [email or web address])

## 2021-06-27 NOTE — Progress Notes (Signed)
Subjective: Tracy Santos is a 50 y.o. female patient presents to office with complaint of moderate heel pain on the Right at the bottom and back that has been going on since Oct 2021. Patient reports in the past has tried injections that helped a little, stretching, rolling can, OTC insoles without complete relief. Denies any other pedal complaints.   Patient Active Problem List   Diagnosis Date Noted   Iron deficiency anemia 03/08/2019   Vitamin D deficiency 03/08/2019    Current Outpatient Medications on File Prior to Visit  Medication Sig Dispense Refill   acetaminophen (TYLENOL) 325 MG tablet Take 650 mg by mouth every 6 (six) hours as needed.     diclofenac Sodium (VOLTAREN) 1 % GEL Apply 2 g topically 4 (four) times daily. 100 g 0   ferrous sulfate 325 (65 FE) MG tablet Take 1 tablet (325 mg total) by mouth 3 (three) times daily with meals.  3   meclizine (MEDI-MECLIZINE) 25 MG tablet Take 1 tablet (25 mg total) by mouth 3 (three) times daily as needed for dizziness. 30 tablet 0   Multiple Vitamin (MULTIVITAMIN WITH MINERALS) TABS tablet Take 1 tablet by mouth daily.     naproxen (NAPROSYN) 500 MG tablet Take 500 mg by mouth 2 (two) times daily.     No current facility-administered medications on file prior to visit.    No Known Allergies  Objective: Physical Exam General: The patient is alert and oriented x3 in no acute distress.  Dermatology: Skin is warm, dry and supple bilateral lower extremities. Nails 1-10 are normal. There is no erythema, edema, no eccymosis, no open lesions present. Integument is otherwise unremarkable.  Vascular: Dorsalis Pedis pulse and Posterior Tibial pulse are 1/4 bilateral. Capillary fill time is immediate to all digits.  Neurological: Grossly intact to light touch bilateral.  Musculoskeletal: Tenderness to palpation at the medial calcaneal tubercale and through the insertion of the plantar fascia on the right foot and to the achilles  insertion. No dell. Thompson sign negative. No pain with compression of calcaneus bilateral. No pain with calf compression bilateral. There is decreased Ankle joint range of motion bilateral. All other joints range of motion within normal limits bilateral. +Pes planus and bunion bilateral. Strength 5/5 in all groups bilateral.   Gait: Unassisted, Antalgic avoid weight on right heel  Xray, Right foot:  Normal osseous mineralization. Joint spaces preserved except midtarsal supportive of pes planus deformity. No fracture/dislocation/boney destruction. Posterior and inferior Calcaneal spur present with mild thickening of plantar fascia. No other soft tissue abnormalities or radiopaque foreign bodies.   Assessment and Plan: Problem List Items Addressed This Visit   None Visit Diagnoses     Pain in right foot    -  Primary   Relevant Orders   DG Foot Complete Right   Plantar fasciitis of right foot       Tendonitis, Achilles, right       Acquired equinus deformity of right foot           -Complete examination performed.  -Xrays reviewed -Discussed with patient in detail the condition of plantar fasciitis, how this occurs and general treatment options. Explained both conservative and surgical treatments.  -After oral consent and aseptic prep, injected a mixture containing 1 ml of 2%  plain lidocaine, 1 ml 0.5% plain marcaine, 0.5 ml of kenalog 10 and 0.5 ml of dexamethasone phosphate into right heel. Post-injection care discussed with patient.  -Rx Diclofenac  -Recommended good supportive shoes and  advised use of OTC insert as tolerated.  - Explained in detail the use of the night splint which was dispensed at today's visit. -Continue daily stretching exercises. -Recommend patient to ice affected area 1-2x daily. -Patient to return to office in 3 weeks for follow up or sooner if problems or questions arise. If symptoms are not better to schedule her for EPAT 3 weeks after.  Landis Martins,  DPM

## 2021-07-18 ENCOUNTER — Ambulatory Visit: Payer: Federal, State, Local not specified - PPO | Admitting: Sports Medicine

## 2021-08-10 IMAGING — MG DIGITAL SCREENING BILAT W/ TOMO W/ CAD
8 series · 8 of 24 positions shown · non-contrast
Comparison: Previous exam(s).

CLINICAL DATA: Screening.

EXAM:
DIGITAL SCREENING BILATERAL MAMMOGRAM WITH TOMO AND CAD

[R MLO synth-2D]
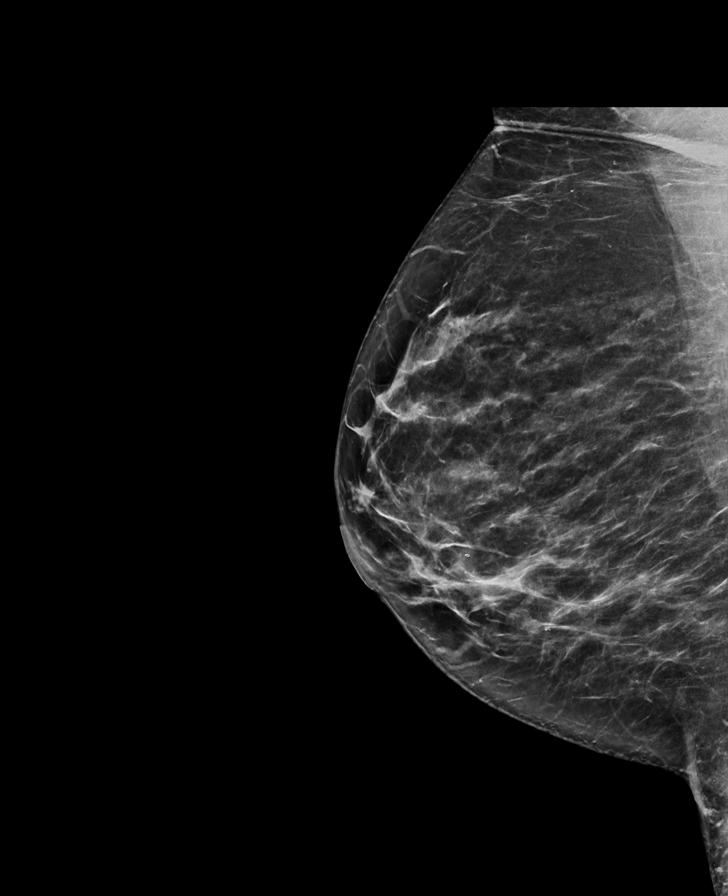

[R CC synth-2D]
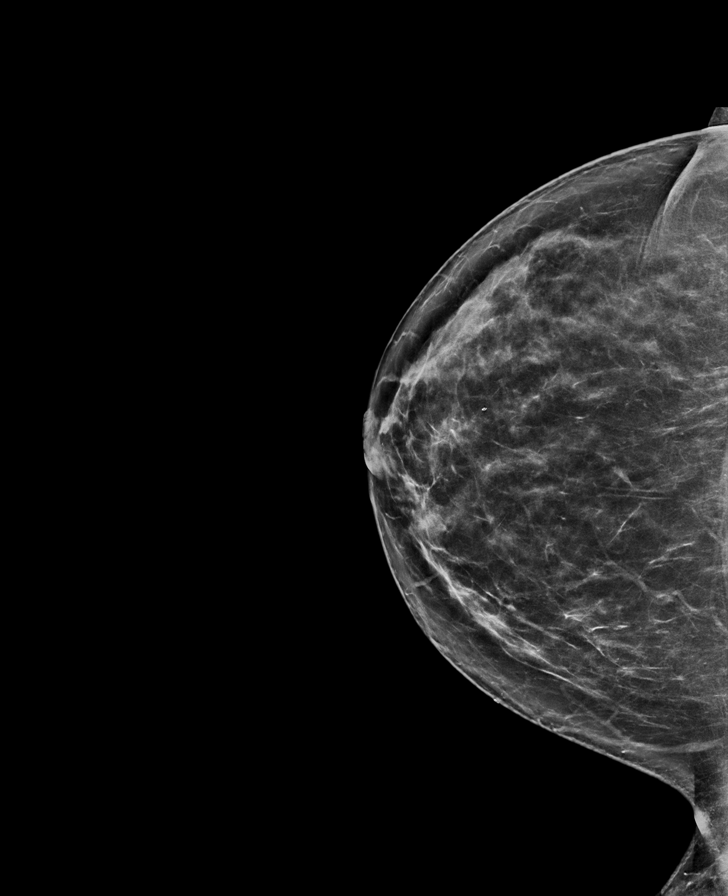

[L CC synth-2D]
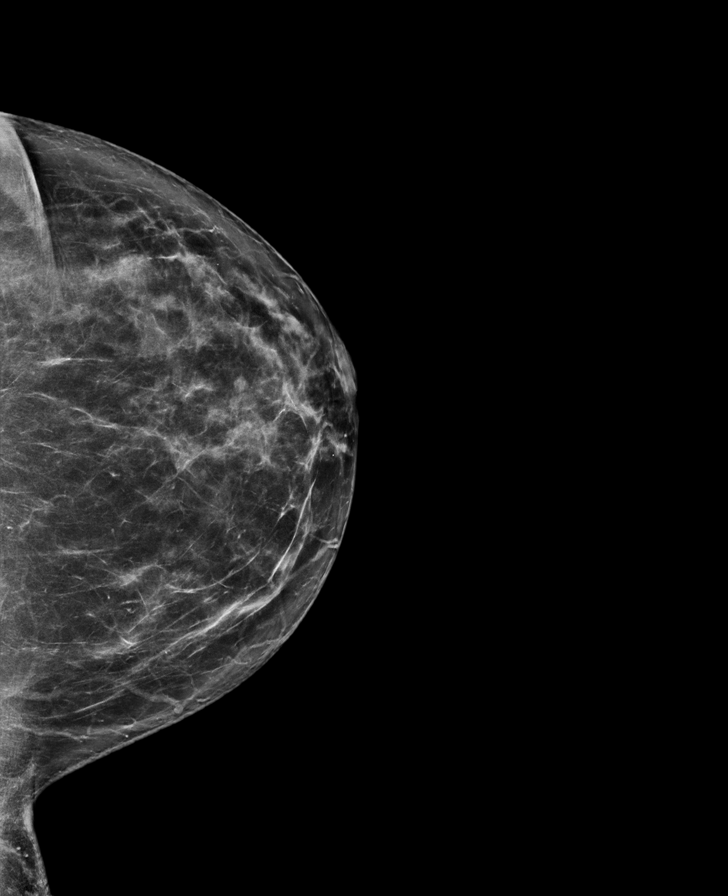

[L MLO synth-2D]
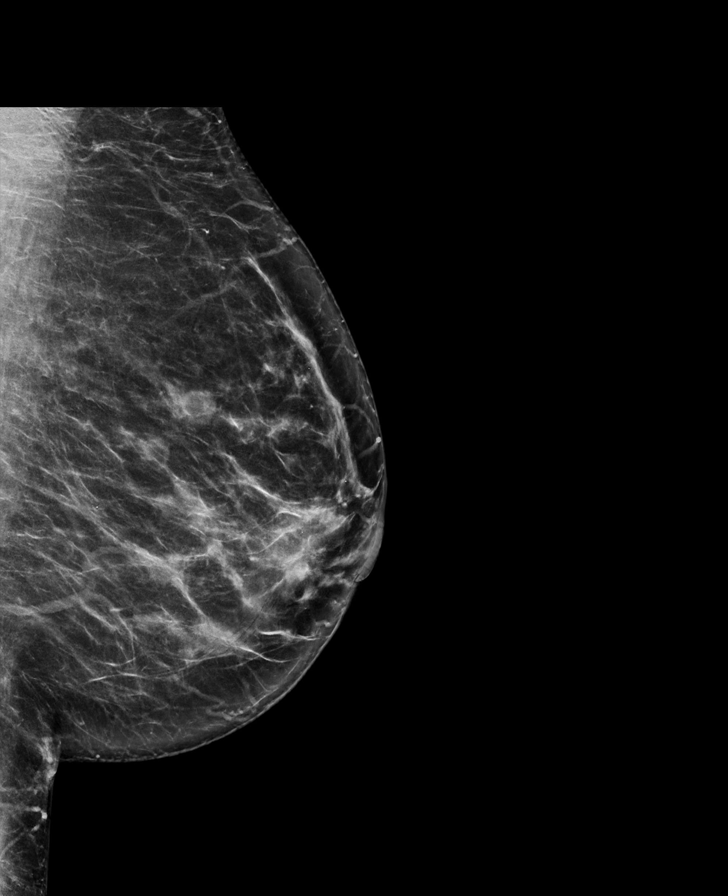

[L MLO tomo · tomo slice 38/75.0]
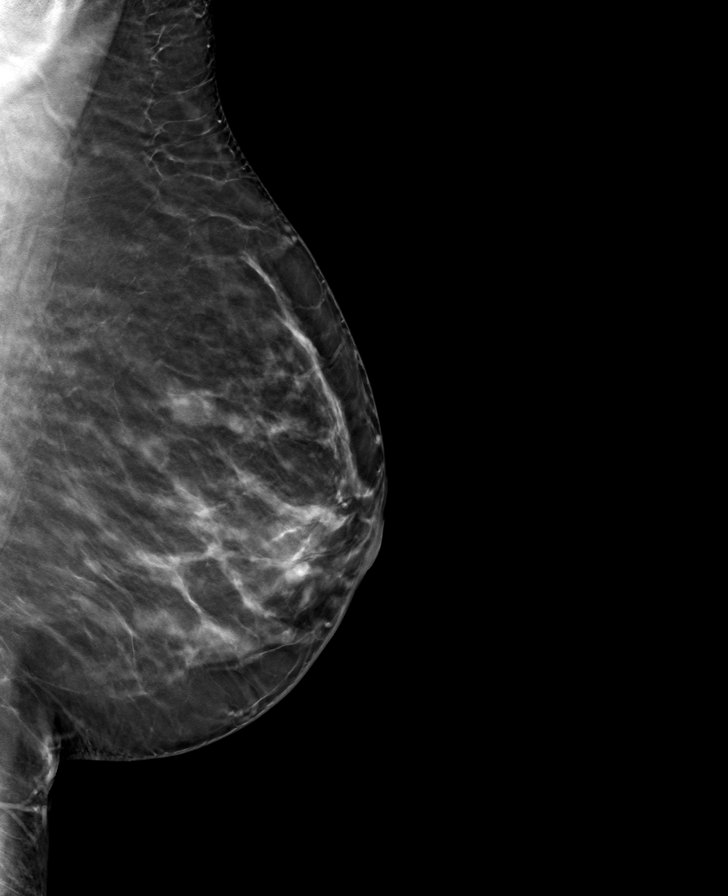

[R MLO tomo · tomo slice 38/75.0]
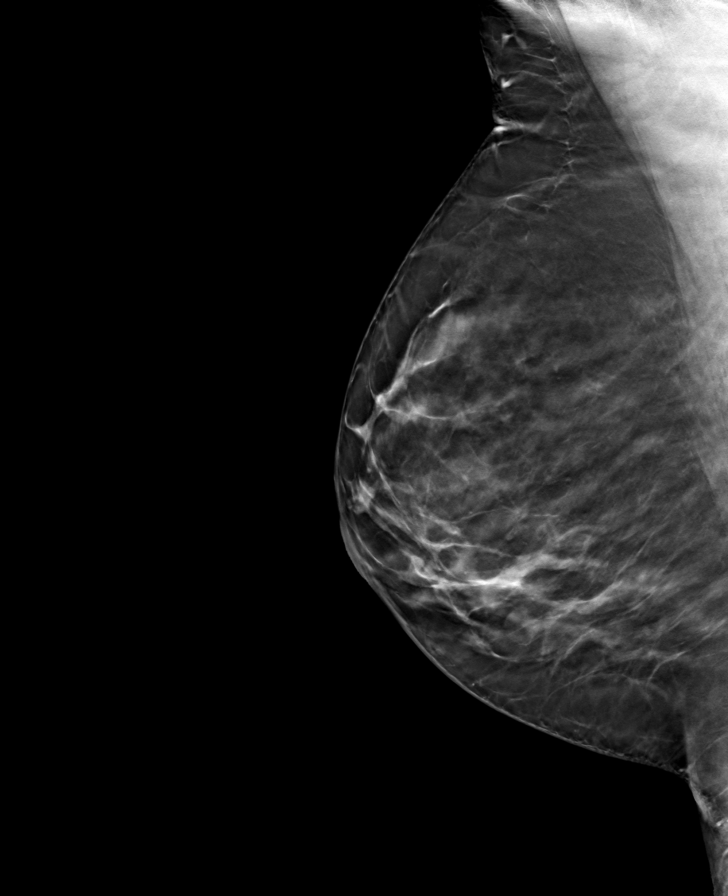

[R CC tomo · tomo slice 39/77.0]
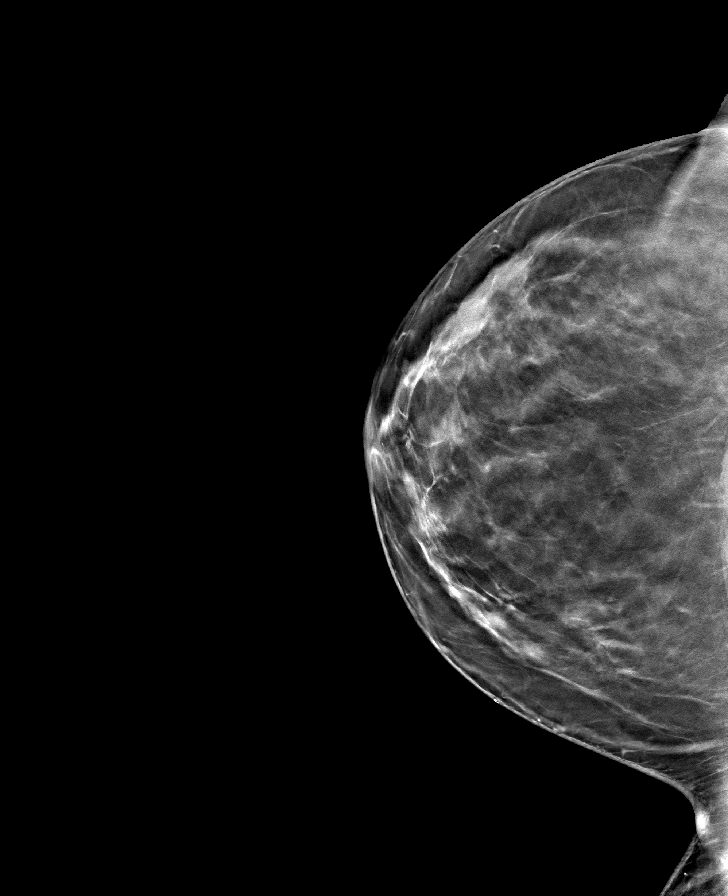

[L CC tomo · tomo slice 40/79.0]
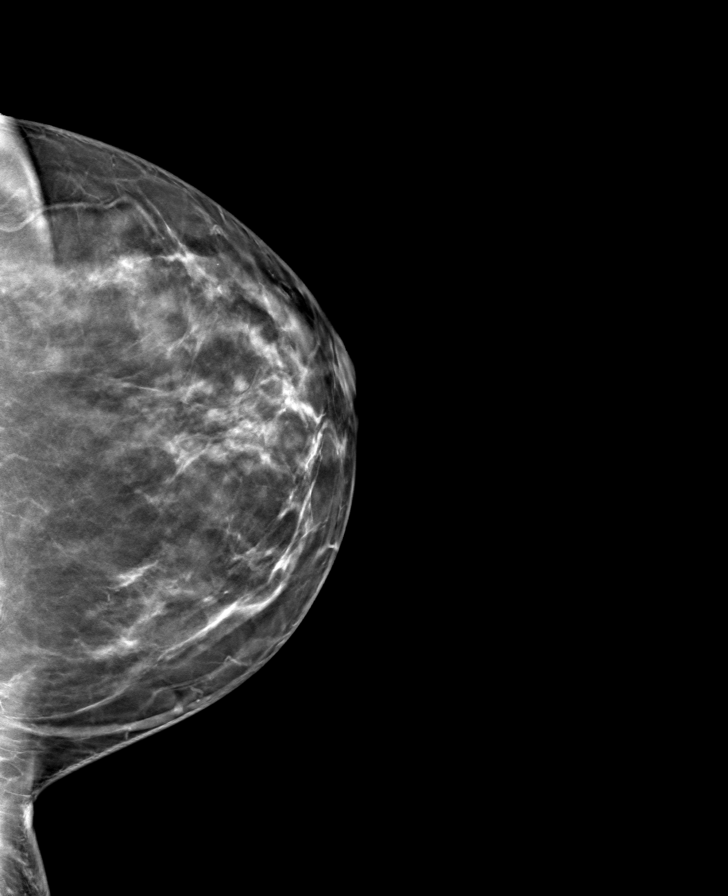

[8 of 24 positions shown; findings below may reference images not displayed]

ACR Breast Density Category b: There are scattered areas of
fibroglandular density.
FINDINGS: There are no findings suspicious for malignancy. Images were
processed with CAD.
IMPRESSION: No mammographic evidence of malignancy. A result letter of this
screening mammogram will be mailed directly to the patient.

RECOMMENDATION:
Screening mammogram in one year. (Code:[id number])

BI-RADS CATEGORY  1: Negative.

## 2021-11-20 DIAGNOSIS — M1711 Unilateral primary osteoarthritis, right knee: Secondary | ICD-10-CM | POA: Diagnosis not present

## 2022-11-09 DIAGNOSIS — Z6841 Body Mass Index (BMI) 40.0 and over, adult: Secondary | ICD-10-CM | POA: Diagnosis not present

## 2022-11-09 DIAGNOSIS — R052 Subacute cough: Secondary | ICD-10-CM | POA: Diagnosis not present

## 2022-11-12 DIAGNOSIS — N920 Excessive and frequent menstruation with regular cycle: Secondary | ICD-10-CM | POA: Diagnosis not present

## 2022-11-12 DIAGNOSIS — N951 Menopausal and female climacteric states: Secondary | ICD-10-CM | POA: Diagnosis not present

## 2022-11-12 DIAGNOSIS — D649 Anemia, unspecified: Secondary | ICD-10-CM | POA: Diagnosis not present

## 2022-11-12 DIAGNOSIS — D259 Leiomyoma of uterus, unspecified: Secondary | ICD-10-CM | POA: Diagnosis not present

## 2022-11-19 DIAGNOSIS — N920 Excessive and frequent menstruation with regular cycle: Secondary | ICD-10-CM | POA: Diagnosis not present

## 2022-11-21 DIAGNOSIS — R9389 Abnormal findings on diagnostic imaging of other specified body structures: Secondary | ICD-10-CM | POA: Diagnosis not present

## 2022-11-21 DIAGNOSIS — Z3202 Encounter for pregnancy test, result negative: Secondary | ICD-10-CM | POA: Diagnosis not present

## 2022-11-21 DIAGNOSIS — N920 Excessive and frequent menstruation with regular cycle: Secondary | ICD-10-CM | POA: Diagnosis not present

## 2022-12-04 DIAGNOSIS — D62 Acute posthemorrhagic anemia: Secondary | ICD-10-CM | POA: Diagnosis not present

## 2022-12-04 DIAGNOSIS — D259 Leiomyoma of uterus, unspecified: Secondary | ICD-10-CM | POA: Diagnosis not present

## 2022-12-04 DIAGNOSIS — N920 Excessive and frequent menstruation with regular cycle: Secondary | ICD-10-CM | POA: Diagnosis not present

## 2022-12-29 ENCOUNTER — Other Ambulatory Visit: Payer: Self-pay | Admitting: Obstetrics and Gynecology

## 2022-12-29 DIAGNOSIS — D250 Submucous leiomyoma of uterus: Secondary | ICD-10-CM

## 2023-02-02 ENCOUNTER — Ambulatory Visit: Payer: Federal, State, Local not specified - PPO | Admitting: Family Medicine

## 2023-02-12 ENCOUNTER — Encounter: Payer: Self-pay | Admitting: Family

## 2023-02-12 ENCOUNTER — Ambulatory Visit: Payer: Federal, State, Local not specified - PPO | Admitting: Family

## 2023-02-12 VITALS — BP 151/96 | HR 70 | Temp 96.7°F | Ht 67.0 in | Wt 284.4 lb

## 2023-02-12 DIAGNOSIS — Z1339 Encounter for screening examination for other mental health and behavioral disorders: Secondary | ICD-10-CM

## 2023-02-12 DIAGNOSIS — R030 Elevated blood-pressure reading, without diagnosis of hypertension: Secondary | ICD-10-CM

## 2023-02-12 DIAGNOSIS — K219 Gastro-esophageal reflux disease without esophagitis: Secondary | ICD-10-CM

## 2023-02-12 NOTE — Progress Notes (Signed)
New Patient Office Visit  Subjective:  Patient ID: Tracy Santos, female    DOB: Nov 05, 1971  Age: 52 y.o. MRN: [id number]  CC:  Chief Complaint  Patient presents with   New Patient (Initial Visit)    Referral for allergy testing.   Gastroesophageal Reflux    P c/o GERD very often. Has tried pepcid which does help sx. Unable to lay back after eating meals.    ADHD    Pt c/o ADHD, sx include not being able to focus, forgetful and overwhelmed easily. Present for about a year.     HPI Tracy Santos presents for establishing care today.  ADHD:  not previously dx, reports struggling in high school, went to several community colleges but did end up with an AA degree. 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 She states she is having problems at home with easy distraction, not able to focus but it can't stick to one thing, or get tasks completed. Feels unorganized often.  Dizziness: reports feels like the blood rushing to her head and pressure in her head, denies worsening of symptoms if she turns her head quickly. Denies nausea and mainly happens at night. Denies lightheadedness upon standing.  GERD:  reports drinking and eating late at night. She feels pain in her chest after eating that goes into her throat and sometimes causes the coughing. Has started OTC Pepcid.   Assessment & Plan:  ADHD (attention deficit hyperactivity disorder) evaluation Assessment & Plan: chronic problem, but never dx has managed sx ok, but finding harder to do lately Adult screening scale indicates moderate ADHD pt with high BP today and advised stimulant meds would be contraindicated at this time, but can consider alternative meds, also behavior modifications can be beneficial pt prefers to try counseling & learning mgt skills for sx reviewed a few tips for organization, handout provided sending referral to Psych for further guidance f/u prn  Orders: -      Ambulatory referral to Psychiatry  Elevated blood pressure reading - pt reports normal BP when checked at home, sometimes elevated at medical offices. Reports dizziness and head pressure & advised this can be a s/s of HTN. BP rechecked several times in office by 2 different CMAs and still elevated up to 168/98. Advised pt on low sodium diet, increasing water intake, increase cardio exercise where able, shooting for 39min as many days as possible, which pt reports she has started doing, as well as she is working on weight loss.  Will continue to monitor. Pt to schedule physical w/labs and will recheck BP at that time.  Gastroesophageal reflux disease without esophagitis Assessment & Plan: New has been taking OTC Pepcid prn advised on low acid diet inc. caffeine, benefits of wt loss, not lying down for 1-2h after meals continue Pepcid, but advised taking qd or bid for 1 week, then qd for best effectiveness f/u prn   Subjective:    Outpatient Medications Prior to Visit  Medication Sig Dispense Refill   ferrous sulfate 325 (65 FE) MG tablet Take 1 tablet (325 mg total) by mouth 3 (three) times daily with meals. (Patient taking differently: Take 325 mg by mouth 3 (three) times daily with meals. OBGYN prescribed)  3   tranexamic acid (LYSTEDA) 650 MG TABS tablet Take by mouth.     acetaminophen (TYLENOL) 325 MG tablet Take 650 mg by mouth every 6 (six) hours as needed.     diclofenac (VOLTAREN) 75 MG  EC tablet Take 1 tablet (75 mg total) by mouth 2 (two) times daily. 30 tablet 0   diclofenac Sodium (VOLTAREN) 1 % GEL Apply 2 g topically 4 (four) times daily. 100 g 0   meclizine (MEDI-MECLIZINE) 25 MG tablet Take 1 tablet (25 mg total) by mouth 3 (three) times daily as needed for dizziness. 30 tablet 0   Multiple Vitamin (MULTIVITAMIN WITH MINERALS) TABS tablet Take 1 tablet by mouth daily.     naproxen (NAPROSYN) 500 MG tablet Take 500 mg by mouth 2 (two) times daily.     Facility-Administered  Medications Prior to Visit  Medication Dose Route Frequency Provider Last Rate Last Admin   triamcinolone acetonide (KENALOG) 10 MG/ML injection 10 mg  10 mg Other Once Landis Martins, DPM       Past Medical History:  Diagnosis Date   Anemia    Asthma    Fibroid    Past Surgical History:  Procedure Laterality Date   UTERINE FIBROID SURGERY      Objective:   Today's Vitals: BP (!) 151/96 (BP Location: Left Arm, Patient Position: Sitting, Cuff Size: Large)   Pulse 70   Temp (!) 96.7 F (35.9 C) (Temporal)   Ht 5\' 7"  (1.702 m)   Wt 284 lb 6.4 oz (129 kg)   LMP 01/16/2023 (Exact Date)   SpO2 99%   BMI 44.54 kg/m   Physical Exam Vitals and nursing note reviewed.  Constitutional:      Appearance: Normal appearance. She is morbidly obese.  Cardiovascular:     Rate and Rhythm: Normal rate and regular rhythm.  Pulmonary:     Effort: Pulmonary effort is normal.     Breath sounds: Normal breath sounds.  Musculoskeletal:        General: Normal range of motion.  Skin:    General: Skin is warm and dry.  Neurological:     Mental Status: She is alert.  Psychiatric:        Mood and Affect: Mood normal.        Behavior: Behavior normal.    *Extra time (77min) spent with patient today which consisted of chart review, discussing diagnoses, work up, treatment, answering questions (focus on HTN), and documentation.  Jeanie Sewer, NP

## 2023-02-12 NOTE — Patient Instructions (Addendum)
Welcome to [facility], It was a pleasure meeting you today!    For your blood pressure, drink at least 8 cups of water daily. Eat a low sodium diet, very little fast food or restaurant food. Increase or start exercise, at least 20 minutes daily getting your heart rate above 120-130 beats per minute. Weight loss, even just a few pounds can be beneficial as well!   Please schedule a physical with fasting labs at your convenience today.     PLEASE NOTE: If you had any LAB tests please let us know if you have not heard back within a few days. You may see your results on MyChart before we have a chance to review them but we will give you a call once they are reviewed by [place]. If we ordered any REFERRALS today, please let us know if you have not heard from their office within the next week.  Let us know through MyChart if you are needing REFILLS, or have your pharmacy send [place] the request. You can also use MyChart to communicate with me or any office staff.  Please try these tips to maintain a healthy lifestyle: It is important that you exercise regularly at least 30 minutes 5 times a week. Think about what you will eat, plan ahead. Choose whole foods, & think  "clean, green, fresh or frozen" over canned, processed or packaged foods which are more sugary, salty, and fatty. 70 to 75% of food eaten should be fresh vegetables and protein. 2-3  meals daily with healthy snacks between meals, but must be whole fruit, protein or vegetables. Aim to eat over a 10 hour period when you are active, for example, 7am to 5pm, and then STOP after your last meal of the day, drinking only water.  Shorter eating windows, 6-8 hours, are showing benefits in heart disease and blood sugar regulation. Drink water every day! Shoot for 64 ounces daily = 8 cups, no other drink is as healthy! Fruit juice is best enjoyed in a healthy way, by EATING the fruit.

## 2023-02-13 DIAGNOSIS — Z1339 Encounter for screening examination for other mental health and behavioral disorders: Secondary | ICD-10-CM | POA: Insufficient documentation

## 2023-02-13 DIAGNOSIS — K219 Gastro-esophageal reflux disease without esophagitis: Secondary | ICD-10-CM | POA: Insufficient documentation

## 2023-02-13 NOTE — Assessment & Plan Note (Addendum)
chronic problem, but never dx has managed sx ok, but finding harder to do lately Adult screening scale indicates moderate ADHD pt with high BP today and advised stimulant meds would be contraindicated at this time, but can consider alternative meds, also behavior modifications can be beneficial pt prefers to try counseling & learning mgt skills for sx reviewed a few tips for organization, handout provided sending referral to Psych for further guidance f/u prn

## 2023-02-13 NOTE — Assessment & Plan Note (Signed)
New has been taking OTC Pepcid prn advised on low acid diet inc. caffeine, benefits of wt loss, not lying down for 1-2h after meals continue Pepcid, but advised taking qd or bid for 1 week, then qd for best effectiveness f/u prn

## 2023-02-16 ENCOUNTER — Encounter: Payer: Federal, State, Local not specified - PPO | Admitting: Family

## 2023-04-19 ENCOUNTER — Other Ambulatory Visit: Payer: Self-pay

## 2023-04-19 ENCOUNTER — Emergency Department (HOSPITAL_COMMUNITY)
Admission: EM | Admit: 2023-04-19 | Discharge: 2023-04-20 | Disposition: A | Discharge status: Home / Self Care | Payer: Federal, State, Local not specified - PPO | Attending: Emergency Medicine | Admitting: Emergency Medicine

## 2023-04-19 ENCOUNTER — Encounter (HOSPITAL_COMMUNITY): Payer: Self-pay | Admitting: *Deleted

## 2023-04-19 DIAGNOSIS — S6292XA Unspecified fracture of left wrist and hand, initial encounter for closed fracture: Secondary | ICD-10-CM | POA: Diagnosis not present

## 2023-04-19 DIAGNOSIS — S62303A Unspecified fracture of third metacarpal bone, left hand, initial encounter for closed fracture: Secondary | ICD-10-CM | POA: Insufficient documentation

## 2023-04-19 DIAGNOSIS — S62102A Fracture of unspecified carpal bone, left wrist, initial encounter for closed fracture: Secondary | ICD-10-CM

## 2023-04-19 DIAGNOSIS — M25542 Pain in joints of left hand: Secondary | ICD-10-CM | POA: Diagnosis not present

## 2023-04-19 DIAGNOSIS — X58XXXA Exposure to other specified factors, initial encounter: Secondary | ICD-10-CM | POA: Diagnosis not present

## 2023-04-19 DIAGNOSIS — M79642 Pain in left hand: Secondary | ICD-10-CM | POA: Diagnosis not present

## 2023-04-19 DIAGNOSIS — J45909 Unspecified asthma, uncomplicated: Secondary | ICD-10-CM | POA: Diagnosis not present

## 2023-04-19 NOTE — ED Triage Notes (Signed)
The pt has had pain and swelling for a week  no known injury  she has been using exercise rings

## 2023-04-20 ENCOUNTER — Emergency Department (HOSPITAL_COMMUNITY): Payer: Federal, State, Local not specified - PPO

## 2023-04-20 DIAGNOSIS — M25542 Pain in joints of left hand: Secondary | ICD-10-CM | POA: Diagnosis not present

## 2023-04-20 MED ORDER — KETOROLAC TROMETHAMINE 10 MG PO TABS
10.0000 mg | ORAL_TABLET | Freq: Once | ORAL | Status: AC
  Administered 2023-04-20: 10 mg via ORAL
  Filled 2023-04-20: qty 1

## 2023-04-20 MED ORDER — NAPROXEN 500 MG PO TABS: 500.0000 mg | ORAL_TABLET | Freq: Two times a day (BID) | ORAL | 0 refills | Status: DC

## 2023-04-20 NOTE — ED Provider Notes (Signed)
[place] [facility] AT [facility] Provider Note   CSN: [id number] Arrival date & time: 04/19/23  2035     History  Chief Complaint  Patient presents with   Hand Pain    Tracy Santos is a 52 y.o. female.  52 year old female presents to the emergency department for evaluation of left hand pain and swelling.  She states that symptoms have been fairly constant for the past 1.5 weeks.  She noticed onset after using gymnastics rings for pull-ups at the gym.  She took Aleve on 1 occasion without symptomatic improvement.  Denies any other alleviating or aggravating factors.  No associated numbness, paresthesia, swelling in the forearm or proximal left upper extremity, fever.  As an aside, she states that she has had similar pain when using exercise equipment in the past, but the pain never lasted this long.  She also reports that she has historically had bilateral joint pain and hand swelling with any degree of sun exposure.  The history is provided by the patient and a significant other. No language interpreter was used.  Hand Pain       Home Medications Prior to Admission medications   Medication Sig Start Date End Date Taking? Authorizing Provider  naproxen (NAPROSYN) 500 MG tablet Take 1 tablet (500 mg total) by mouth 2 (two) times daily with a meal. 04/20/23  Yes Antony Madura, PA-C  ferrous sulfate 325 (65 FE) MG tablet Take 1 tablet (325 mg total) by mouth 3 (three) times daily with meals. Patient taking differently: Take 325 mg by mouth 3 (three) times daily with meals. OBGYN prescribed 12/08/19   Philip Aspen, Limmie Patricia, MD  tranexamic acid (LYSTEDA) 650 MG TABS tablet Take by mouth. 11/12/22   [provider]      Allergies    Patient has no known allergies.    Review of Systems   Review of Systems Ten systems reviewed and are negative for acute change, except as noted in the HPI.    Physical Exam Updated Vital Signs BP (!) 162/98    Pulse 69   Temp 98.2 F (36.8 C)   Resp 17   Ht 5\' 7"  (1.702 m)   Wt 129 kg   LMP 04/15/2023   SpO2 100%   BMI 44.54 kg/m   Physical Exam Vitals and nursing note reviewed.  Constitutional:      General: She is not in acute distress.    Appearance: She is well-developed. She is not diaphoretic.     Comments: Nontoxic appearing and in NAD  HENT:     Head: Normocephalic and atraumatic.  Eyes:     General: No scleral icterus.    Conjunctiva/sclera: Conjunctivae normal.  Cardiovascular:     Rate and Rhythm: Normal rate and regular rhythm.     Pulses: Normal pulses.     Comments: Capillary refill brisk in all digits of the L hand. Distal radial pulse 2+ in the LUE. Pulmonary:     Effort: Pulmonary effort is normal. No respiratory distress.  Musculoskeletal:        General: Normal range of motion.     Cervical back: Normal range of motion.     Comments: Preserved ROM of the R hand. Normal grip strength. Some mild swelling of the palmar aspect of the 2nd-5th MCP joints. Point tenderness at the 3rd, 4th MCP. Minimal erythema without heat to touch, lymphangitic streaking. No fluctuance on palpation. No fusiform swelling of digits.  Skin:  General: Skin is warm and dry.     Coloration: Skin is not pale.     Findings: No erythema or rash.  Neurological:     Mental Status: She is alert and oriented to person, place, and time.  Psychiatric:        Behavior: Behavior normal.     ED Results / Procedures / Treatments   Labs (all labs ordered are listed, but only abnormal results are displayed) Labs Reviewed - No data to display  EKG None  Radiology DG Hand Complete Left  Result Date: 04/20/2023 CLINICAL DATA:  Pain at metacarpophalangeal joints EXAM: LEFT HAND - COMPLETE 3+ VIEW COMPARISON:  None Available. FINDINGS: Frontal, oblique, and lateral views of the left hand are obtained. Prominent sesamoid bones are seen adjacent to the first, second, and third metacarpals. The  sesamoid bone adjacent to the third metacarpal head appears to be fractured, with a longitudinal fracture line noted. No other acute bony abnormalities. Mild osteoarthritis noted throughout the interphalangeal joints and first carpometacarpal joint. Soft tissues appear unremarkable. IMPRESSION: 1. Likely fractured sesamoid bone adjacent to the head of the third metacarpal. Please correlate with site of patient's pain. 2. Mild diffuse osteoarthritis. Electronically Signed   By: Sharlet Salina M.D.   On: 04/20/2023 01:43    Procedures Procedures    Medications Ordered in ED Medications  ketorolac (TORADOL) tablet 10 mg (10 mg Oral Given 04/20/23 0140)    ED Course/ Medical Decision Making/ A&P Clinical Course as of 04/20/23 0217  Mon Apr 20, 2023  0159 X-ray findings do question fractured sesamoid bone adjacent to the head of the third metacarpal.  This does correlate with the area patient's point tenderness. [KH]    Clinical Course User Index [KH] Antony Madura, PA-C                             Medical Decision Making Amount and/or Complexity of Data Reviewed Radiology: ordered.  Risk Prescription drug management.   This patient presents to the ED for concern of L hand pain, this involves an extensive number of treatment options, and is a complaint that carries with it a high risk of complications and morbidity.  The differential diagnosis includes fracture vs arthritis vs sprain/contusion vs septic joint   Co morbidities that complicate the patient evaluation  Asthma Anemia   Additional history obtained:  Additional history obtained from spouse, at bedside   Imaging Studies ordered:  I ordered imaging studies including Xray L hand  I independently visualized and interpreted imaging which showed suspected sesamoid bone fracture adjacent to the third MCP joint I agree with the radiologist interpretation   Cardiac Monitoring:  The patient was maintained on a cardiac  monitor.  I personally viewed and interpreted the cardiac monitored which showed an underlying rhythm of: NSR   Medicines ordered and prescription drug management:  I ordered medication including Toradol for pain  I have reviewed the patients home medicines and have made adjustments as needed   Problem List / ED Course:  X-ray findings concerning for fracture.  Patient placed in volar splint. Neurovascularly intact with preserved range of motion of the left hand and digits. No concern for septic joint.   Reevaluation:  After the interventions noted above, I reevaluated the patient and found that they have :stayed the same   Social Determinants of Health:  Good social support   Dispostion:  After consideration of the diagnostic results  and the patients response to treatment, I feel that the patent would benefit from maintenance of volar splint pending outpatient f/u with hand surgery. Referral provided. Return precautions discussed and provided. Patient discharged in stable condition with no unaddressed concerns.          Final Clinical Impression(s) / ED Diagnoses Final diagnoses:  Closed fracture of sesamoid bone of left hand, initial encounter    Rx / DC Orders ED Discharge Orders          Ordered    Ambulatory referral to Orthopedics       Comments: [facility] - hand surgery   04/20/23 0215    naproxen (NAPROSYN) 500 MG tablet  2 times daily with meals        04/20/23 0217              Antony Madura, PA-C 04/20/23 1610    Shon Baton, MD 04/20/23 2311

## 2023-04-20 NOTE — Discharge Instructions (Addendum)
Follow up with Orthopedic hand surgery to ensure proper healing of your broken bone. Take Naproxen as prescribed for pain. Wear your splint at all times; do not remove it to shower. See your primary care doctor for symptom management in the interim, as needed. Return for new or concerning symptoms.

## 2023-04-20 NOTE — ED Notes (Signed)
Ortho tech at bedside placing splint 

## 2023-04-20 NOTE — Progress Notes (Signed)
Orthopedic Tech Progress Note Patient Details:  Tracy Santos 1971/07/03 [id number]  Ortho Devices Type of Ortho Device: Volar splint Ortho Device/Splint Location: lue Ortho Device/Splint Interventions: Ordered, Application, Adjustment   Post Interventions Patient Tolerated: Well Instructions Provided: Care of device, Adjustment of device  [name] 04/20/2023, 3:10 AM

## 2023-04-20 NOTE — ED Notes (Signed)
Ortho tech paged  

## 2023-04-27 DIAGNOSIS — M79642 Pain in left hand: Secondary | ICD-10-CM | POA: Diagnosis not present

## 2023-04-29 DIAGNOSIS — M79642 Pain in left hand: Secondary | ICD-10-CM | POA: Diagnosis not present

## 2023-07-15 DIAGNOSIS — R41840 Attention and concentration deficit: Secondary | ICD-10-CM | POA: Diagnosis not present

## 2023-07-15 DIAGNOSIS — Z1330 Encounter for screening examination for mental health and behavioral disorders, unspecified: Secondary | ICD-10-CM | POA: Diagnosis not present

## 2023-07-15 DIAGNOSIS — Z1231 Encounter for screening mammogram for malignant neoplasm of breast: Secondary | ICD-10-CM | POA: Diagnosis not present

## 2023-07-15 DIAGNOSIS — Z0000 Encounter for general adult medical examination without abnormal findings: Secondary | ICD-10-CM | POA: Diagnosis not present

## 2023-07-15 DIAGNOSIS — N951 Menopausal and female climacteric states: Secondary | ICD-10-CM | POA: Diagnosis not present

## 2023-07-15 DIAGNOSIS — D500 Iron deficiency anemia secondary to blood loss (chronic): Secondary | ICD-10-CM | POA: Diagnosis not present

## 2024-03-19 ENCOUNTER — Encounter (HOSPITAL_BASED_OUTPATIENT_CLINIC_OR_DEPARTMENT_OTHER): Payer: Self-pay

## 2024-03-19 ENCOUNTER — Emergency Department (HOSPITAL_BASED_OUTPATIENT_CLINIC_OR_DEPARTMENT_OTHER)
Admission: EM | Admit: 2024-03-19 | Discharge: 2024-03-19 | Disposition: A | Discharge status: Home / Self Care | Attending: Emergency Medicine | Admitting: Emergency Medicine

## 2024-03-19 ENCOUNTER — Other Ambulatory Visit: Payer: Self-pay

## 2024-03-19 DIAGNOSIS — M222X2 Patellofemoral disorders, left knee: Secondary | ICD-10-CM | POA: Insufficient documentation

## 2024-03-19 DIAGNOSIS — M222X1 Patellofemoral disorders, right knee: Secondary | ICD-10-CM | POA: Insufficient documentation

## 2024-03-19 DIAGNOSIS — M7052 Other bursitis of knee, left knee: Secondary | ICD-10-CM | POA: Insufficient documentation

## 2024-03-19 DIAGNOSIS — M7051 Other bursitis of knee, right knee: Secondary | ICD-10-CM | POA: Diagnosis not present

## 2024-03-19 DIAGNOSIS — M25561 Pain in right knee: Secondary | ICD-10-CM | POA: Diagnosis present

## 2024-03-19 DIAGNOSIS — Y9389 Activity, other specified: Secondary | ICD-10-CM | POA: Insufficient documentation

## 2024-03-19 DIAGNOSIS — M25562 Pain in left knee: Secondary | ICD-10-CM

## 2024-03-19 MED ORDER — KETOROLAC TROMETHAMINE 60 MG/2ML IM SOLN
60.0000 mg | Freq: Once | INTRAMUSCULAR | Status: AC
  Administered 2024-03-19: 60 mg via INTRAMUSCULAR
  Filled 2024-03-19: qty 2

## 2024-03-19 MED ORDER — LIDOCAINE 5 % EX PTCH: 1.0000 | MEDICATED_PATCH | CUTANEOUS | 0 refills | Status: AC

## 2024-03-19 MED ORDER — DEXAMETHASONE SODIUM PHOSPHATE 10 MG/ML IJ SOLN
10.0000 mg | Freq: Once | INTRAMUSCULAR | Status: AC
  Administered 2024-03-19: 10 mg via INTRAMUSCULAR
  Filled 2024-03-19: qty 1

## 2024-03-19 MED ORDER — NAPROXEN 500 MG PO TABS: 500.0000 mg | ORAL_TABLET | Freq: Two times a day (BID) | ORAL | 0 refills | Status: AC | PRN

## 2024-03-19 NOTE — ED Triage Notes (Signed)
 Patient presents to ED with c/o bilat knee pain that started one month ago. Patient reports attempting new workout routine. Denies injury or trauma. Pulses and sensation intact.

## 2024-03-21 NOTE — ED Provider Notes (Signed)
 [place] [facility] AT [facility] Provider Note   CSN: [id number] Arrival date & time: 03/19/24  1505     History  Chief Complaint  Patient presents with   Knee Pain    Tracy Santos is a 53 y.o. female.  HPI     53 year old female with no clinically significant medical history presents with concern for bilateral knee pain.  Reports that she started a new workout routine over the last month and her knees bilaterally have had continuing and worsening pain.  Denies injury or trauma.  Denies fever, history of IV drug use.  The pain is located to the front of her knees particularly when she is going up the stairs, and also has pain and tenderness medially in the knees bilaterally.  Does not have other leg swelling.  Denies chest pain, shortness of breath.  Reports that in the past she has been diagnosed with arthritis and given a steroid injection which helped pain. Home Medications Prior to Admission medications   Medication Sig Start Date End Date Taking? Authorizing Provider  lidocaine (LIDODERM) 5 % Place 1 patch onto the skin daily. Remove & Discard patch within 12 hours or as directed by MD 03/19/24  Yes Scarlette Currier, MD  ferrous sulfate  325 (65 FE) MG tablet Take 1 tablet (325 mg total) by mouth 3 (three) times daily with meals. Patient taking differently: Take 325 mg by mouth 3 (three) times daily with meals. OBGYN prescribed 12/08/19   Zilphia Hilt, Charyl Coppersmith, MD  naproxen  (NAPROSYN ) 500 MG tablet Take 1 tablet (500 mg total) by mouth 2 (two) times daily as needed for moderate pain (pain score 4-6). 03/19/24   Scarlette Currier, MD  tranexamic acid (LYSTEDA) 650 MG TABS tablet Take by mouth. 11/12/22   [provider]      Allergies    Patient has no known allergies.    Review of Systems   Review of Systems  Physical Exam Updated Vital Signs BP (!) 161/91 (BP Location: Right Arm)   Pulse 84   Temp 98.6 F (37 C) (Oral)   Resp 16    Ht 5\' 7"  (1.702 m)   Wt 124.7 kg   LMP 03/04/2024   SpO2 99%   BMI 43.07 kg/m  Physical Exam  ED Results / Procedures / Treatments   Labs (all labs ordered are listed, but only abnormal results are displayed) Labs Reviewed - No data to display  EKG None  Radiology No results found.  Procedures Procedures    Medications Ordered in ED Medications  dexamethasone (DECADRON) injection 10 mg (10 mg Intramuscular Given 03/19/24 1705)  ketorolac  (TORADOL ) injection 60 mg (60 mg Intramuscular Given 03/19/24 1705)    ED Course/ Medical Decision Making/ A&P                                  53 year old female with no clinically significant medical history presents with concern for bilateral knee pain.   Has normal pulses bilaterally, no sign of acute arterial thrombus.  No swelling or asymmetric leg swelling and have a low suspicion for DVT.  No fevers or risks of septic arthritis and low suspicion for septic arthritis by history and exam.  Discussed with possible that symptoms may be related to exacerbation of her arthritis with new overuse, but also from soda or possible patellofemoral syndrome given pain in the front of the knee with walking stairs,  and with her tenderness over the Pez anserine bursa bursa bilaterally concern for possible Pez anserine bursitis.  Discussed other abnormalities of the knee are possible such as meniscal pathology.  Regardless, do not feel an x-ray at this time would change clinical plan as have low suspicion for fracture or dislocation in the absence of trauma.  She reports improvement with steroids in past.  She was given short prescription for steroids, lidocaine patch, and recommend continued supportive care with Tylenol and naproxen  rx.  She has seen [facility] in the past and recommend follow-up with the orthopedic providers, rest and activity as tolerated. Patient discharged in stable condition with understanding of reasons to return.          Final Clinical Impression(s) / ED Diagnoses Final diagnoses:  Patellofemoral syndrome of both knees  Pes anserinus bursitis of both knees  Acute pain of both knees    Rx / DC Orders ED Discharge Orders          Ordered    lidocaine (LIDODERM) 5 %  Every 24 hours        03/19/24 1653    naproxen  (NAPROSYN ) 500 MG tablet  2 times daily PRN        03/19/24 1653              Scarlette Currier, MD 03/21/24 1634
